# Patient Record
Sex: Female | Born: 1955 | Race: Black or African American | Hispanic: No | Marital: Single | Smoking: Never smoker
Health system: Southern US, Community
[De-identification: ages and names within clinical notes are randomized; demographics above are authoritative.]

## PROBLEM LIST (undated history)

## (undated) DIAGNOSIS — Z973 Presence of spectacles and contact lenses: Secondary | ICD-10-CM

## (undated) DIAGNOSIS — I10 Essential (primary) hypertension: Secondary | ICD-10-CM

## (undated) DIAGNOSIS — R7303 Prediabetes: Secondary | ICD-10-CM

## (undated) DIAGNOSIS — J45909 Unspecified asthma, uncomplicated: Secondary | ICD-10-CM

## (undated) DIAGNOSIS — E119 Type 2 diabetes mellitus without complications: Secondary | ICD-10-CM

## (undated) HISTORY — PX: CHOLECYSTECTOMY: SHX55

## (undated) HISTORY — DX: Type 2 diabetes mellitus without complications: E11.9

## (undated) HISTORY — PX: ABDOMINAL HYSTERECTOMY: SHX81

---

## 2011-03-01 HISTORY — PX: HERNIA REPAIR: SHX51

## 2013-07-05 ENCOUNTER — Ambulatory Visit: Payer: Self-pay | Admitting: Family Medicine

## 2013-07-05 DIAGNOSIS — I519 Heart disease, unspecified: Secondary | ICD-10-CM

## 2013-09-27 ENCOUNTER — Ambulatory Visit: Payer: Self-pay | Admitting: Family Medicine

## 2014-11-08 ENCOUNTER — Encounter (HOSPITAL_COMMUNITY): Payer: Self-pay | Admitting: *Deleted

## 2014-11-08 ENCOUNTER — Emergency Department (HOSPITAL_COMMUNITY)
Admission: EM | Admit: 2014-11-08 | Discharge: 2014-11-08 | Disposition: A | Discharge status: Home / Self Care | Payer: Federal, State, Local not specified - PPO | Attending: Emergency Medicine | Admitting: Emergency Medicine

## 2014-11-08 DIAGNOSIS — I10 Essential (primary) hypertension: Secondary | ICD-10-CM | POA: Diagnosis not present

## 2014-11-08 DIAGNOSIS — R252 Cramp and spasm: Secondary | ICD-10-CM | POA: Insufficient documentation

## 2014-11-08 DIAGNOSIS — Z87891 Personal history of nicotine dependence: Secondary | ICD-10-CM | POA: Diagnosis not present

## 2014-11-08 DIAGNOSIS — R42 Dizziness and giddiness: Secondary | ICD-10-CM | POA: Diagnosis present

## 2014-11-08 DIAGNOSIS — E860 Dehydration: Secondary | ICD-10-CM | POA: Insufficient documentation

## 2014-11-08 HISTORY — DX: Essential (primary) hypertension: I10

## 2014-11-08 LAB — I-STAT CHEM 8, ED
BUN: 28 mg/dL — ABNORMAL HIGH (ref 6–20)
Calcium, Ion: 1.2 mmol/L (ref 1.12–1.23)
Chloride: 104 mmol/L (ref 101–111)
Creatinine, Ser: 1.9 mg/dL — ABNORMAL HIGH (ref 0.44–1.00)
Glucose, Bld: 87 mg/dL (ref 65–99)
HCT: 44 % (ref 36.0–46.0)
Hemoglobin: 15 g/dL (ref 12.0–15.0)
Potassium: 3.4 mmol/L — ABNORMAL LOW (ref 3.5–5.1)
Sodium: 144 mmol/L (ref 135–145)
TCO2: 22 mmol/L (ref 0–100)

## 2014-11-08 LAB — CK: CK TOTAL: 563 U/L — AB (ref 38–234)

## 2014-11-08 MED ORDER — METHOCARBAMOL 500 MG PO TABS: 500.0000 mg | ORAL_TABLET | Freq: Four times a day (QID) | ORAL | Status: DC | PRN

## 2014-11-08 MED ORDER — SODIUM CHLORIDE 0.9 % IV BOLUS (SEPSIS)
1000.0000 mL | Freq: Once | INTRAVENOUS | Status: AC
  Administered 2014-11-08: 1000 mL via INTRAVENOUS

## 2014-11-08 MED ORDER — METHOCARBAMOL 1000 MG/10ML IJ SOLN
1000.0000 mg | Freq: Once | INTRAVENOUS | Status: AC
  Administered 2014-11-08: 1000 mg via INTRAVENOUS
  Filled 2014-11-08: qty 10

## 2014-11-08 MED ORDER — SODIUM CHLORIDE 0.9 % IV BOLUS (SEPSIS)
1000.0000 mL | Freq: Once | INTRAVENOUS | Status: AC
Start: 2014-11-08 — End: 2014-11-08
  Administered 2014-11-08: 1000 mL via INTRAVENOUS

## 2014-11-08 NOTE — ED Notes (Signed)
Pt was dancing at [employer], not drinking fluids, EMS suspect ETOH but pt denies.  Pt c/o cramping in hands and dehydration.  EMS: 18 L AC, NS, 4 zophran, 114/80, Normal Sinus on monitor, CBG 129

## 2014-11-08 NOTE — ED Provider Notes (Signed)
CSN: [id number]     Arrival date & time 11/08/14  1730 History   First MD Initiated Contact with Patient 11/08/14 1735     Chief Complaint  Patient presents with  . Dehydration     (Consider location/radiation/quality/duration/timing/severity/associated sxs/prior Treatment) The history is provided by the patient.     Per EMS [place], developed cramping all over. Pt states she ate some food from a food truck that tasted bad, states she then got light headed and nauseated and started cramping all over.  She did eat breakfast this morning.  The cramping and nausea have resolved and currently she feels fine, though she does have occasional sharp cramps in her legs.  She thinks she is dehydrated and has food poisoning.  Denies vomiting, diarrhea, abdominal pain, CP, SOB.    Past Medical History  Diagnosis Date  . Hypertension    Past Surgical History  Procedure Laterality Date  . Hernia repair  2013    abdominal.   History reviewed. No pertinent family history. Social History  Substance Use Topics  . Smoking status: Former Smoker    Types: Cigarettes  . Smokeless tobacco: None  . Alcohol Use: Yes   OB History    No data available     Review of Systems  Constitutional: Negative for fever.  Respiratory: Negative for shortness of breath.   Cardiovascular: Negative for chest pain.  Gastrointestinal: Positive for nausea. Negative for vomiting, abdominal pain and diarrhea.  Musculoskeletal: Positive for myalgias.  Skin: Negative for rash.  Allergic/Immunologic: Negative for immunocompromised state.  Neurological: Negative for weakness and numbness.  Hematological: Does not bruise/bleed easily.  Psychiatric/Behavioral: Negative for self-injury.      Allergies  Review of patient's allergies indicates no known allergies.  Home Medications   Prior to Admission medications   Not on File   BP 104/63 mmHg  Pulse 90  Temp(Src) 97.9 F (36.6 C)  (Oral)  Resp 18  Ht  (1.676 m)  Wt 225 lb (102.059 kg)  BMI 36.33 kg/m2  SpO2 95% Physical Exam  Constitutional: She appears well-developed and well-nourished. No distress.  HENT:  Head: Normocephalic and atraumatic.  Neck: Neck supple.  Cardiovascular: Normal rate and regular rhythm.   Pulmonary/Chest: Effort normal and breath sounds normal. No respiratory distress. She has no wheezes. She has no rales.  Abdominal: Soft. She exhibits no distension. There is no tenderness. There is no rebound and no guarding.  Musculoskeletal: She exhibits no edema or tenderness.  Neurological: She is alert.  Skin: She is not diaphoretic.  Psychiatric: She has a normal mood and affect. Her behavior is normal.  Nursing note and vitals reviewed.   ED Course  Procedures (including critical care time) Labs Review Labs Reviewed  CK - Abnormal; Notable for the following:    Total CK 563 (*)    All other components within normal limits  I-STAT CHEM 8, ED - Abnormal; Notable for the following:    Potassium 3.4 (*)    BUN 28 (*)    Creatinine, Ser 1.90 (*)    All other components within normal limits    Imaging Review No results found. I have personally reviewed and evaluated these images and lab results as part of my medical decision-making.   EKG Interpretation None       8:01 PM Pt requests to leave, declines staying to finish second liter IVF.  Reviewed labs with Dr Madilyn Hook.  Pt advised to drink  lots of water, hold her lisinopril/HCTZ and monitor her blood pressure at home, follow up Monday with her PCP to have her renal function rechecked.   MDM   Final diagnoses:  Dehydration    Afebrile, nontoxic patient with muscle cramping, lightheadedness, and nausea while outside in the heat at the local folk [place].  Found to be severely dehydrated.  BUN creat 28, 1.9.  CK 563.  No prior renal function studies available.  Pt given IVF, robaxin for muscle cramps with great improvement.  Pt  declined to stay for second liter IVF, feeling much better.  Discussed labs, encouraged PO intake, close recheck with PCP, hold BP medication until cleared by PCP.   D/C home with robaxin.  Discussed result, findings, treatment, and follow up  with patient.  Pt given return precautions.  Pt verbalizes understanding and agrees with plan.         Trixie Dredge, PA-C 11/08/14 2111  Tilden Fossa, MD 11/09/14 [phone]

## 2014-11-08 NOTE — Discharge Instructions (Signed)
Read the information below.  Use the prescribed medication as directed.  Please discuss all new medications with your pharmacist.  You may return to the Emergency Department at any time for worsening condition or any new symptoms that concern you.   Please drink lots of fluids over the next few days.   See you primary care provider as soon as possible for a recheck of you kidney function.  Until then, Do Not take your lisinopril-HCTZ but do monitor you blood pressure closely.    Dehydration, Adult Dehydration is when you lose more fluids from the body than you take in. Vital organs like the kidneys, brain, and heart cannot function without a proper amount of fluids and salt. Any loss of fluids from the body can cause dehydration.  CAUSES   Vomiting.  Diarrhea.  Excessive sweating.  Excessive urine output.  Fever. SYMPTOMS  Mild dehydration  Thirst.  Dry lips.  Slightly dry mouth. Moderate dehydration  Very dry mouth.  Sunken eyes.  Skin does not bounce back quickly when lightly pinched and released.  Dark urine and decreased urine production.  Decreased tear production.  Headache. Severe dehydration  Very dry mouth.  Extreme thirst.  Rapid, weak pulse (more than 100 beats per minute at rest).  Cold hands and feet.  Not able to sweat in spite of heat and temperature.  Rapid breathing.  Blue lips.  Confusion and lethargy.  Difficulty being awakened.  Minimal urine production.  No tears. DIAGNOSIS  Your caregiver will diagnose dehydration based on your symptoms and your exam. Blood and urine tests will help confirm the diagnosis. The diagnostic evaluation should also identify the cause of dehydration. TREATMENT  Treatment of mild or moderate dehydration can often be done at home by increasing the amount of fluids that you drink. It is best to drink small amounts of fluid more often. Drinking too much at one time can make vomiting worse. Refer to the  home care instructions below. Severe dehydration needs to be treated at the hospital where you will probably be given intravenous (IV) fluids that contain water and electrolytes. HOME CARE INSTRUCTIONS   Ask your caregiver about specific rehydration instructions.  Drink enough fluids to keep your urine clear or pale yellow.  Drink small amounts frequently if you have nausea and vomiting.  Eat as you normally do.  Avoid:  Foods or drinks high in sugar.  Carbonated drinks.  Juice.  Extremely hot or cold fluids.  Drinks with caffeine.  Fatty, greasy foods.  Alcohol.  Tobacco.  Overeating.  Gelatin desserts.  Wash your hands well to avoid spreading bacteria and viruses.  Only take over-the-counter or prescription medicines for pain, discomfort, or fever as directed by your caregiver.  Ask your caregiver if you should continue all prescribed and over-the-counter medicines.  Keep all follow-up appointments with your caregiver. SEEK MEDICAL CARE IF:  You have abdominal pain and it increases or stays in one area (localizes).  You have a rash, stiff neck, or severe headache.  You are irritable, sleepy, or difficult to awaken.  You are weak, dizzy, or extremely thirsty. SEEK IMMEDIATE MEDICAL CARE IF:   You are unable to keep fluids down or you get worse despite treatment.  You have frequent episodes of vomiting or diarrhea.  You have blood or green matter (bile) in your vomit.  You have blood in your stool or your stool looks black and tarry.  You have not urinated in 6 to 8 hours, or you  have only urinated a small amount of very dark urine.  You have a fever.  You faint. MAKE SURE YOU:   Understand these instructions.  Will watch your condition.  Will get help right away if you are not doing well or get worse. Document Released: 02/14/2005 Document Revised: 05/09/2011 Document Reviewed: 10/04/2010 [facility] Patient Information 2015 [place], [place]. This  information is not intended to replace advice given to you by your health care provider. Make sure you discuss any questions you have with your health care provider.

## 2015-05-04 ENCOUNTER — Other Ambulatory Visit: Payer: Self-pay | Admitting: Family Medicine

## 2015-05-04 DIAGNOSIS — Z1231 Encounter for screening mammogram for malignant neoplasm of breast: Secondary | ICD-10-CM

## 2016-07-23 ENCOUNTER — Inpatient Hospital Stay: Payer: Federal, State, Local not specified - PPO

## 2016-07-23 ENCOUNTER — Emergency Department: Payer: Federal, State, Local not specified - PPO

## 2016-07-23 ENCOUNTER — Inpatient Hospital Stay
Admission: EM | Admit: 2016-07-23 | Discharge: 2016-07-26 | DRG: 390 | Disposition: A | Discharge status: Home / Self Care | Payer: Federal, State, Local not specified - PPO | Attending: General Surgery | Admitting: General Surgery

## 2016-07-23 DIAGNOSIS — Z7984 Long term (current) use of oral hypoglycemic drugs: Secondary | ICD-10-CM

## 2016-07-23 DIAGNOSIS — Z87891 Personal history of nicotine dependence: Secondary | ICD-10-CM

## 2016-07-23 DIAGNOSIS — Z79899 Other long term (current) drug therapy: Secondary | ICD-10-CM

## 2016-07-23 DIAGNOSIS — K56609 Unspecified intestinal obstruction, unspecified as to partial versus complete obstruction: Secondary | ICD-10-CM | POA: Diagnosis present

## 2016-07-23 DIAGNOSIS — K56600 Partial intestinal obstruction, unspecified as to cause: Principal | ICD-10-CM | POA: Diagnosis present

## 2016-07-23 DIAGNOSIS — Z978 Presence of other specified devices: Secondary | ICD-10-CM

## 2016-07-23 HISTORY — DX: Unspecified asthma, uncomplicated: J45.909

## 2016-07-23 LAB — COMPREHENSIVE METABOLIC PANEL
ALBUMIN: 4.1 g/dL (ref 3.5–5.0)
ALK PHOS: 50 U/L (ref 38–126)
ALT: 19 U/L (ref 14–54)
AST: 25 U/L (ref 15–41)
Anion gap: 7 (ref 5–15)
BILIRUBIN TOTAL: 0.7 mg/dL (ref 0.3–1.2)
BUN: 16 mg/dL (ref 6–20)
CALCIUM: 9.3 mg/dL (ref 8.9–10.3)
CO2: 28 mmol/L (ref 22–32)
CREATININE: 0.71 mg/dL (ref 0.44–1.00)
Chloride: 104 mmol/L (ref 101–111)
GFR calc Af Amer: >60 mL/min (ref >60)
GFR calc non Af Amer: >60 mL/min (ref >60)
GLUCOSE: 147 mg/dL — AB (ref 65–99)
Potassium: 3.5 mmol/L (ref 3.5–5.1)
Sodium: 139 mmol/L (ref 135–145)
TOTAL PROTEIN: 8.1 g/dL (ref 6.5–8.1)

## 2016-07-23 LAB — CBC
HCT: 39.4 % (ref 35.0–47.0)
HEMOGLOBIN: 13.3 g/dL (ref 12.0–16.0)
MCH: 28.6 pg (ref 26.0–34.0)
MCHC: 33.7 g/dL (ref 32.0–36.0)
MCV: 84.9 fL (ref 80.0–100.0)
PLATELETS: 169 10*3/uL (ref 150–440)
RBC: 4.64 MIL/uL (ref 3.80–5.20)
RDW: 14.1 % (ref 11.5–14.5)
WBC: 10.6 10*3/uL (ref 3.6–11.0)

## 2016-07-23 LAB — URINALYSIS, COMPLETE (UACMP) WITH MICROSCOPIC
BILIRUBIN URINE: NEGATIVE
Bacteria, UA: NONE SEEN
Glucose, UA: NEGATIVE mg/dL
Hgb urine dipstick: NEGATIVE
KETONES UR: 5 mg/dL — AB
Leukocytes, UA: NEGATIVE
Nitrite: NEGATIVE
Protein, ur: 100 mg/dL — AB
Specific Gravity, Urine: 1.016 (ref 1.005–1.030)
pH: 8 (ref 5.0–8.0)

## 2016-07-23 LAB — TYPE AND SCREEN
ABO/RH(D): O POS
Antibody Screen: NEGATIVE

## 2016-07-23 LAB — TROPONIN I

## 2016-07-23 LAB — GLUCOSE, CAPILLARY
GLUCOSE-CAPILLARY: 110 mg/dL — AB (ref 65–99)
Glucose-Capillary: 133 mg/dL — ABNORMAL HIGH (ref 65–99)

## 2016-07-23 LAB — LIPASE, BLOOD: Lipase: 26 U/L (ref 11–51)

## 2016-07-23 IMAGING — CR DG ABDOMEN ACUTE W/ 1V CHEST
3 series · 3 of 3 positions shown · non-contrast
Comparison: None.

CLINICAL DATA: Nausea and vomiting.  Abdominal pain.

EXAM:
DG ABDOMEN ACUTE W/ 1V CHEST

[chest pa]
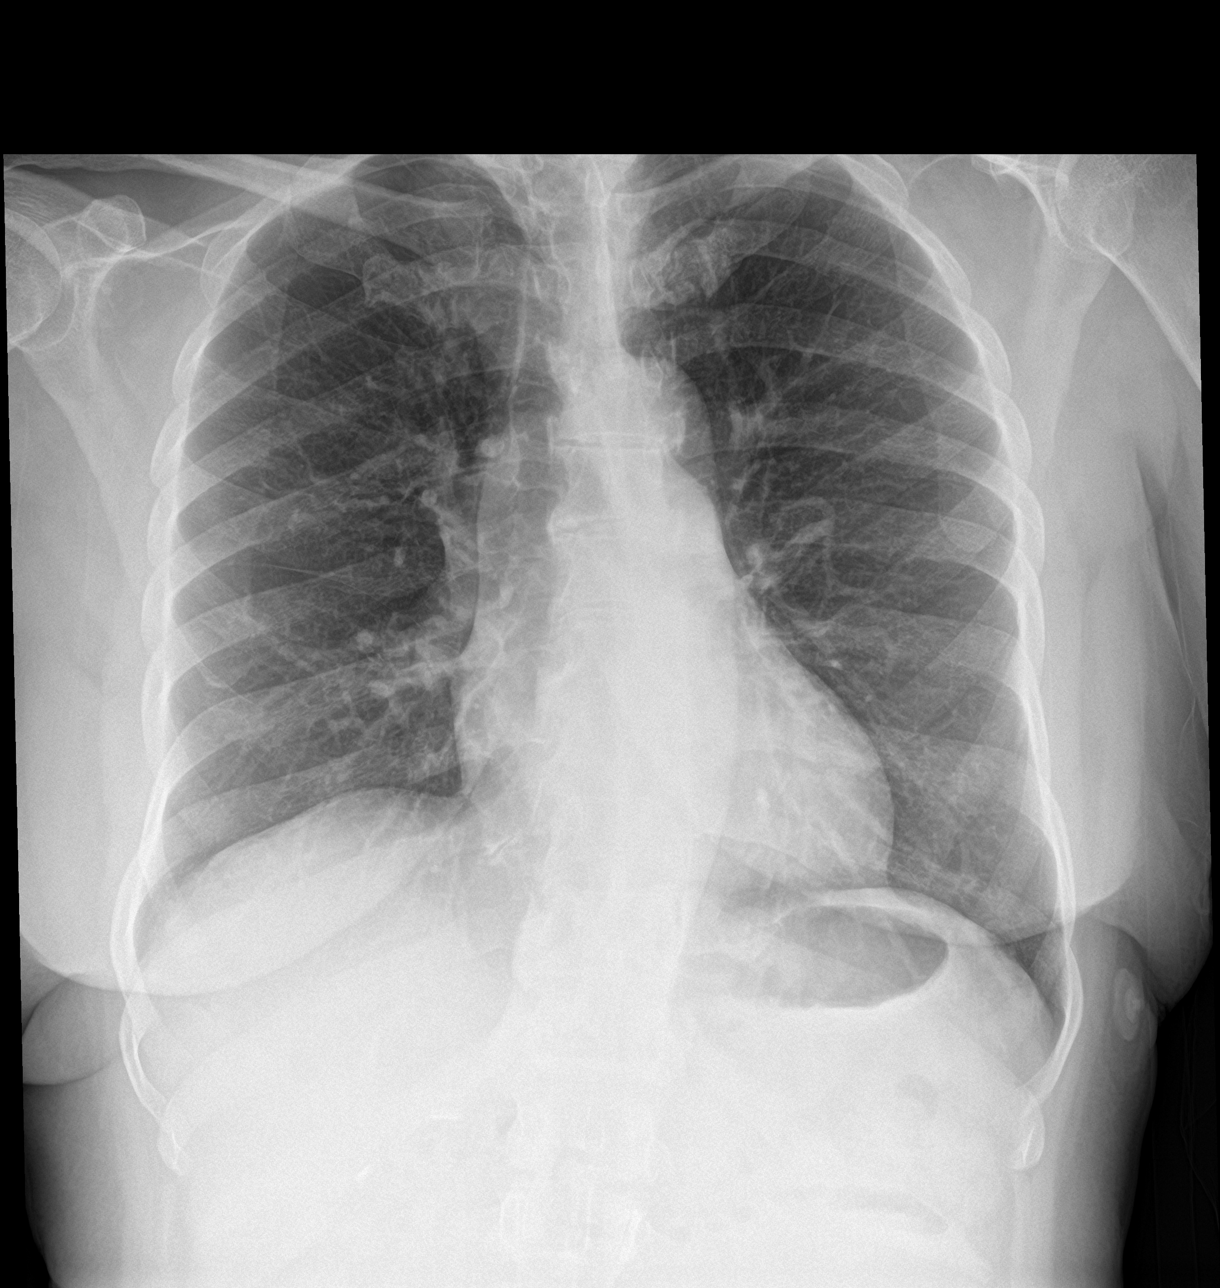

[abdomen erect]
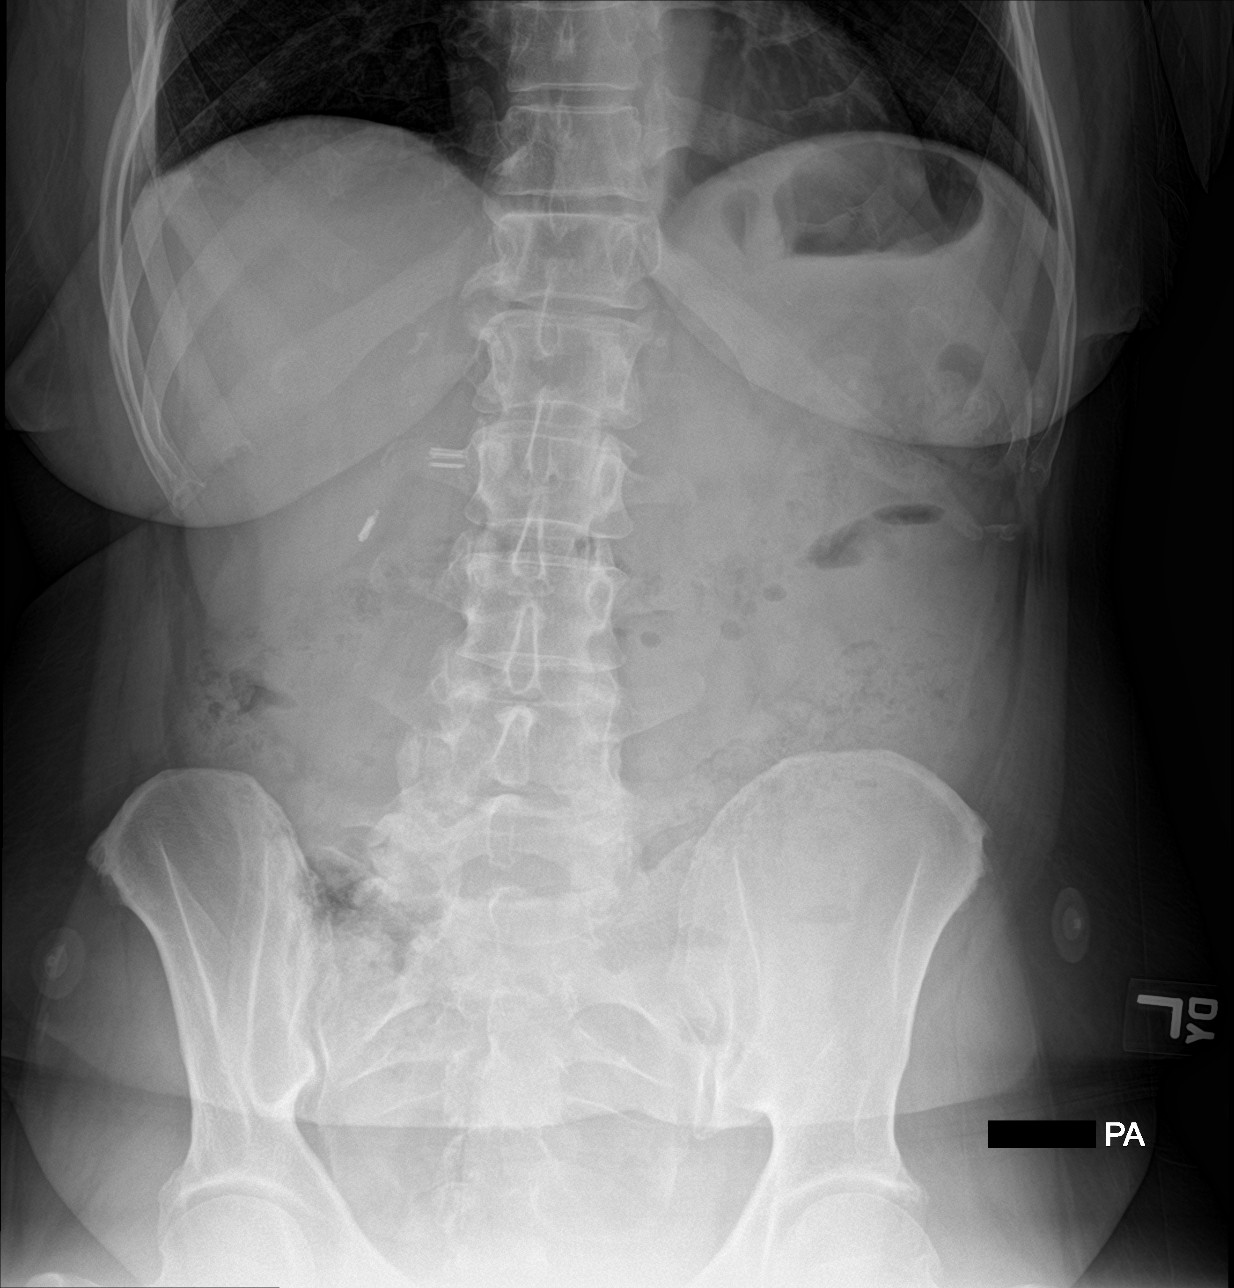

[abdomen supine]
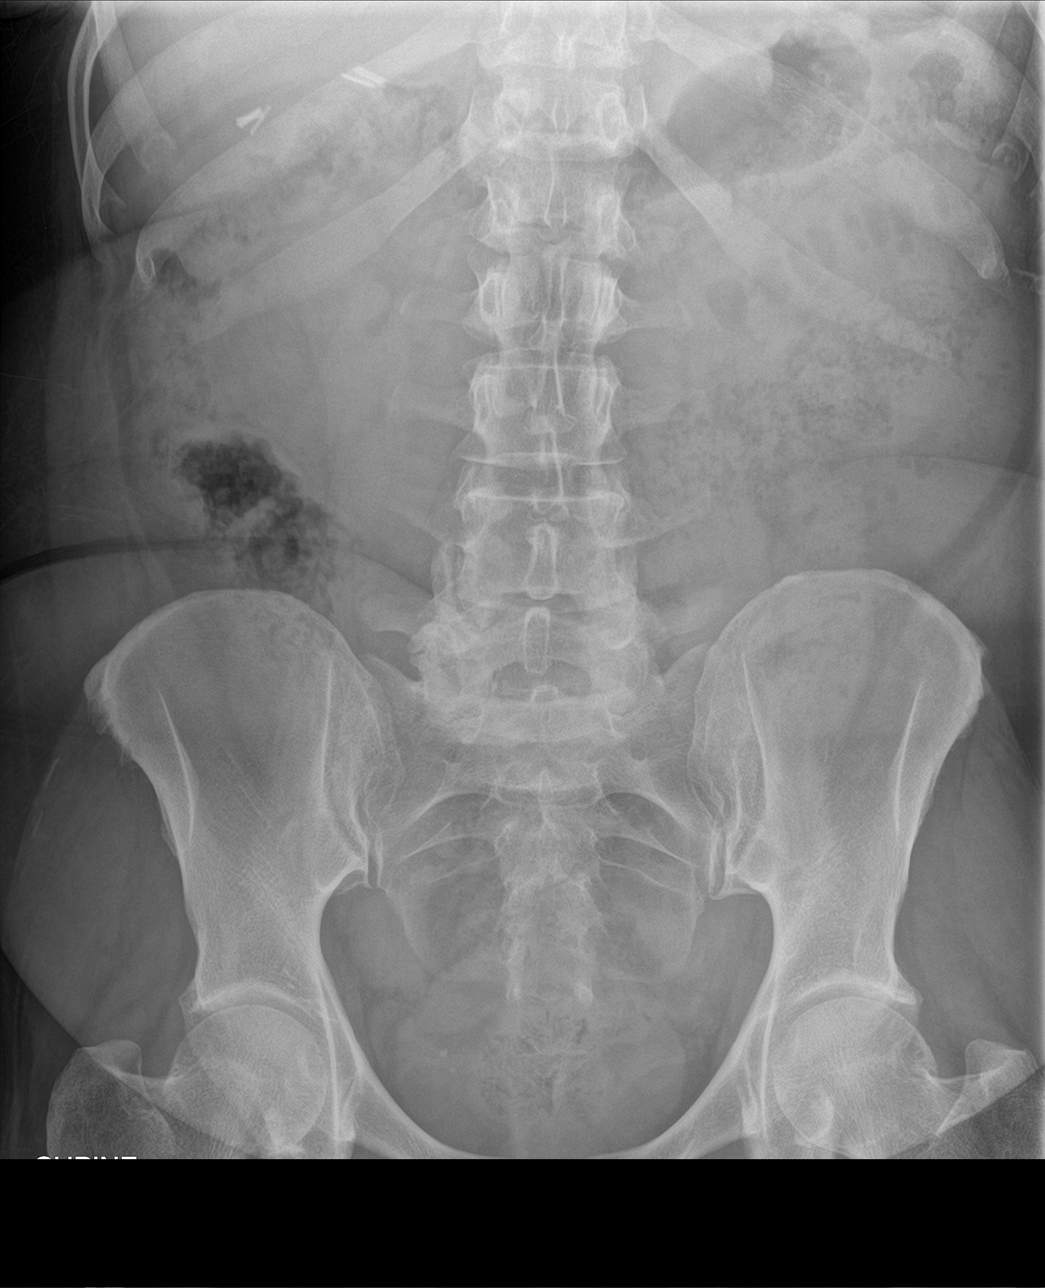

[3 of 3 positions shown; findings below may reference images not displayed]

FINDINGS: There is no evidence of dilated bowel loops or free intraperitoneal
air. No radiopaque calculi or other significant radiographic
abnormality is seen. Heart size and mediastinal contours are within
normal limits. Both lungs are clear.
IMPRESSION: Negative abdominal radiographs.  No acute cardiopulmonary disease.

## 2016-07-23 IMAGING — CT CT ABD-PELV W/ CM
2 of 5 series · 16 of 46 positions shown, 18 images · IV contrast (APPLIED)
Comparison: None.

CLINICAL DATA: Nausea and vomiting for several hours

EXAM:
CT ABDOMEN AND PELVIS WITH CONTRAST
TECHNIQUE: Multidetector CT imaging of the abdomen and pelvis was performed
using the standard protocol following bolus administration of
intravenous contrast.
CONTRAST:  100mL [L9] IOPAMIDOL ([L9]) INJECTION 61%

[Series 2: routine abd/pel with · axial · 0.70mm/px · z∈[-429,-44]mm · 13 of 87 slices shown, 15 images]
[im 5/87  soft-tissue]
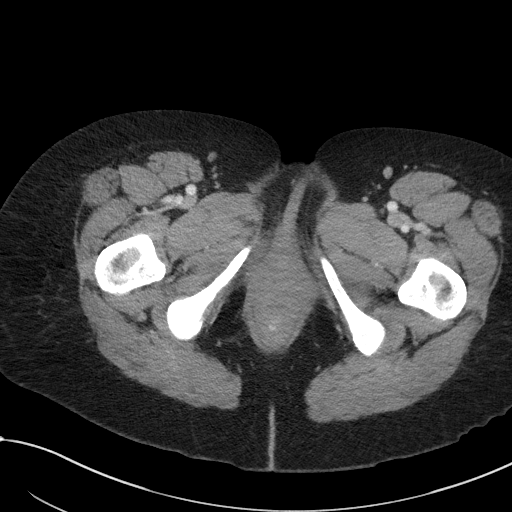
[im 5/87  bone]
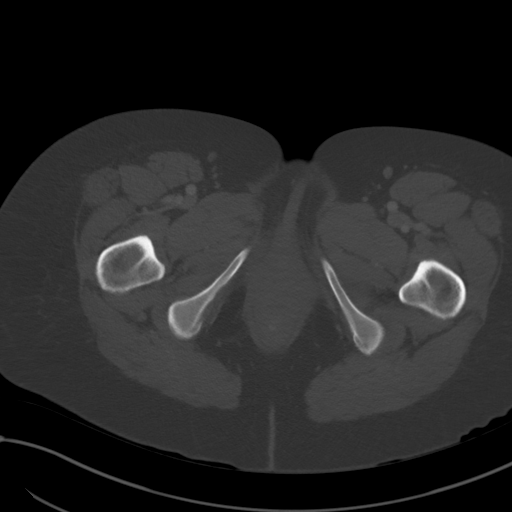
[im 10/87  soft-tissue]
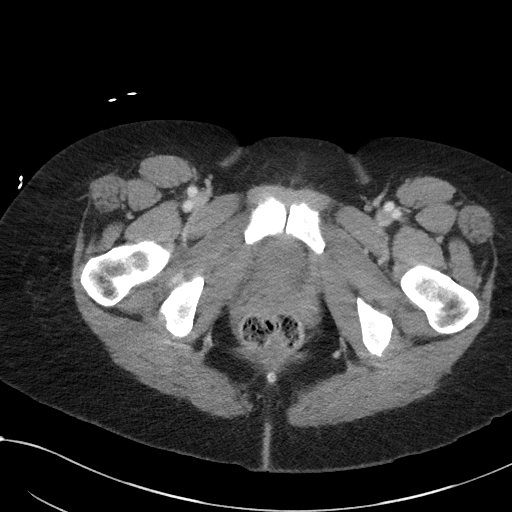
[im 20/87  soft-tissue]
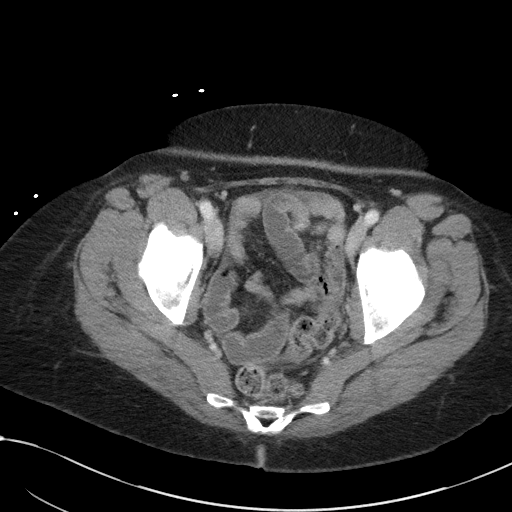
[im 24/87  soft-tissue]
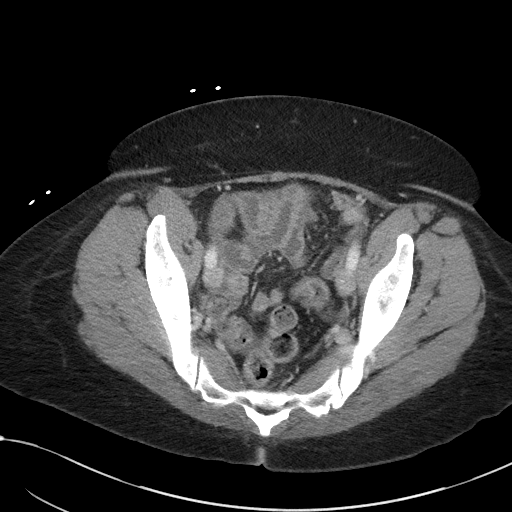
[im 29/87  soft-tissue]
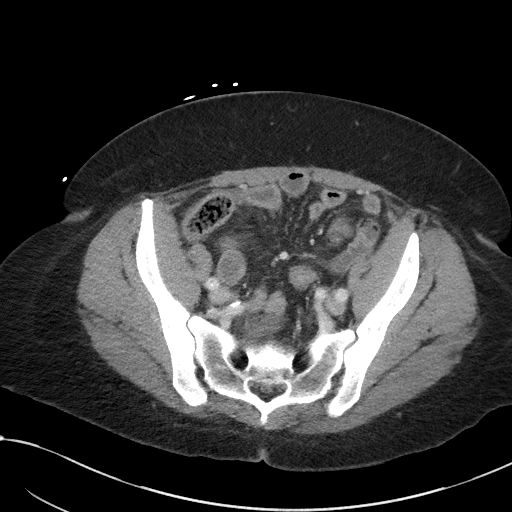
[im 39/87  soft-tissue]
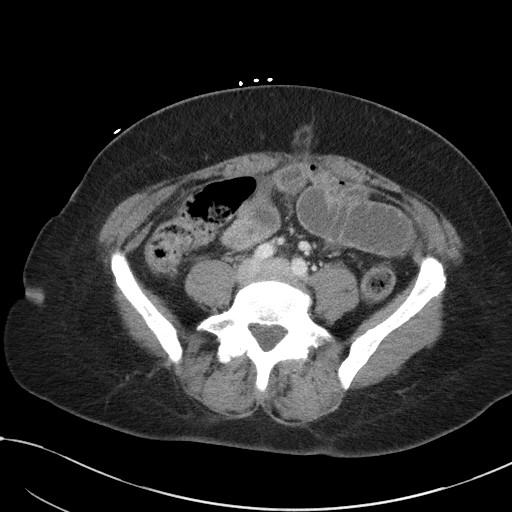
[im 44/87  soft-tissue]
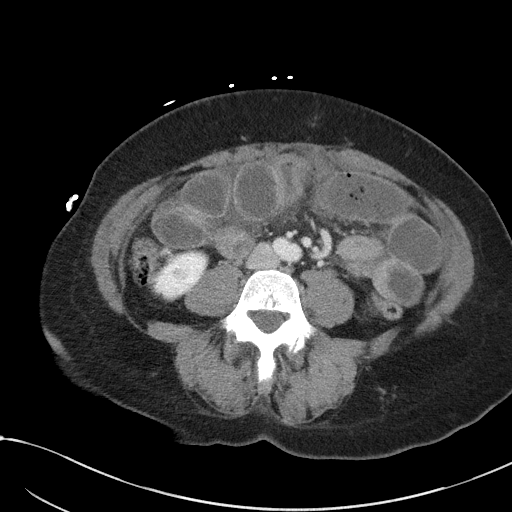
[im 48/87  soft-tissue]
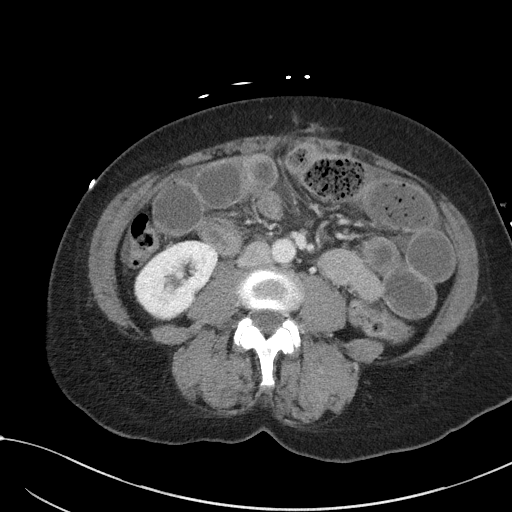
[im 58/87  soft-tissue]
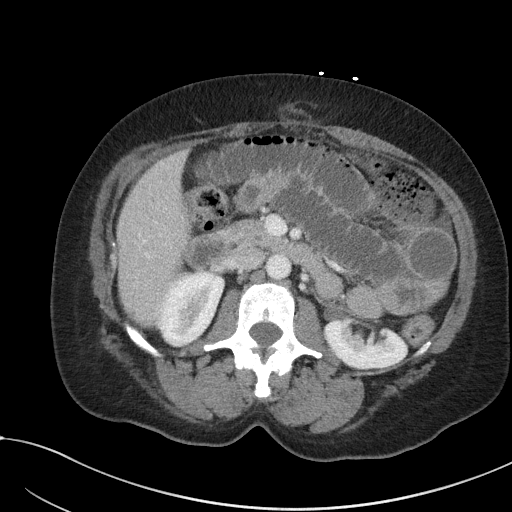
[im 58/87  bone]
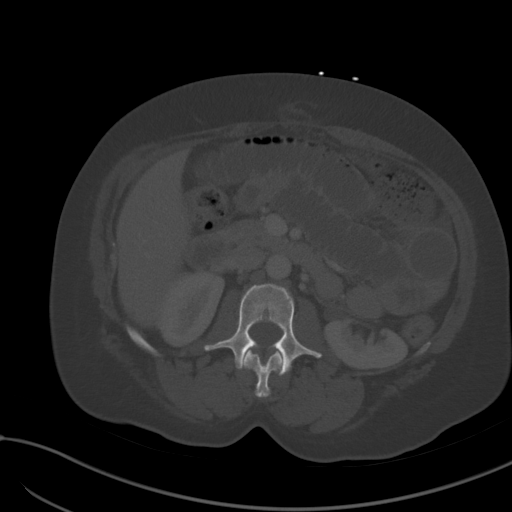
[im 63/87  soft-tissue]
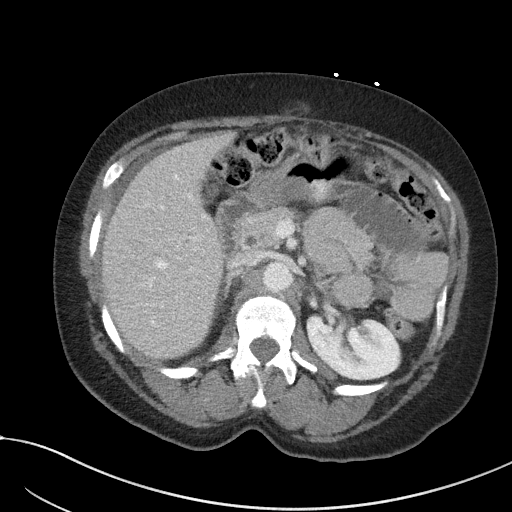
[im 67/87  soft-tissue]
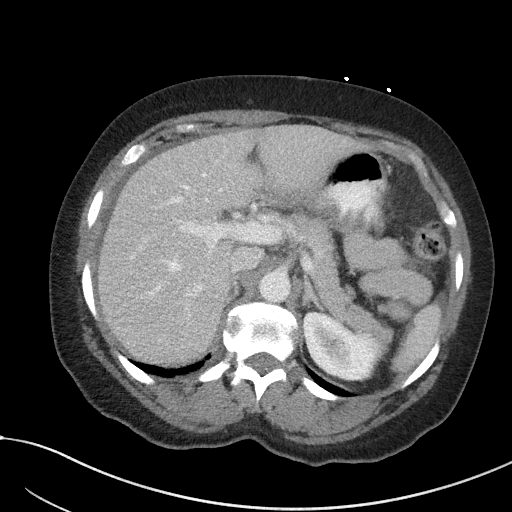
[im 77/87  soft-tissue]
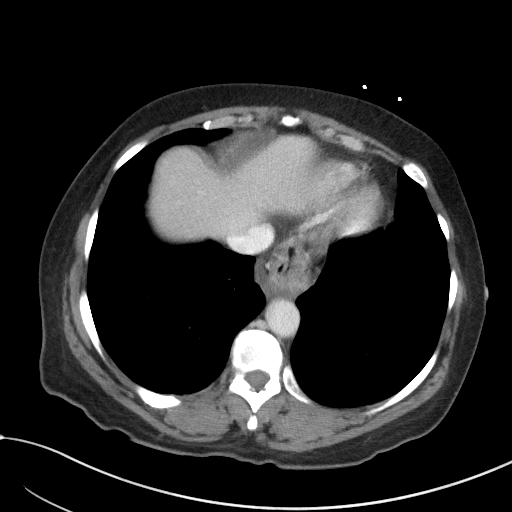
[im 82/87  soft-tissue]
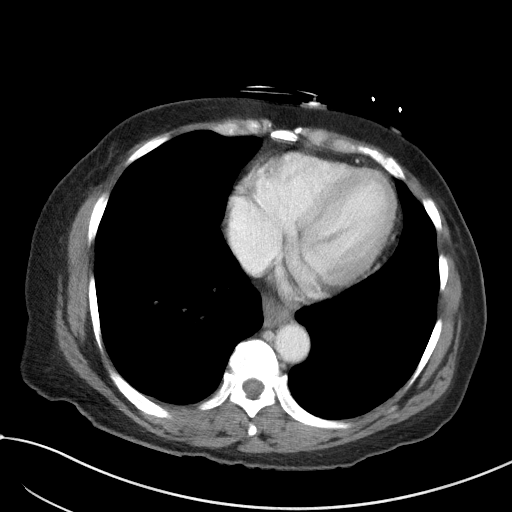

[Series 5: coronal st · coronal · 0.68mm/px · 3 of 89 slices shown]
[im 30/89  soft-tissue]
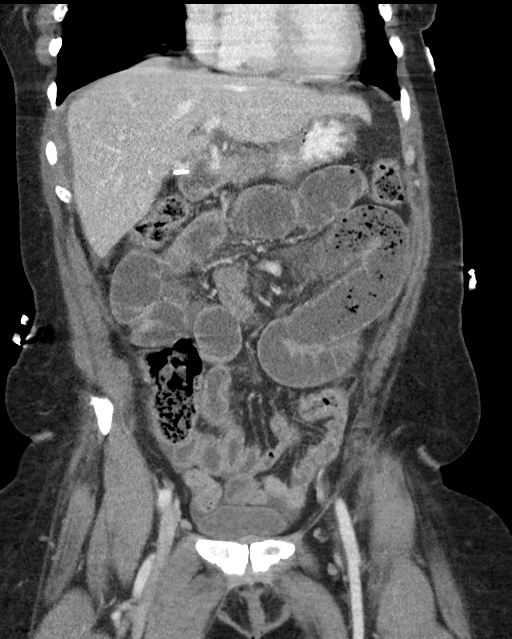
[im 40/89  soft-tissue]
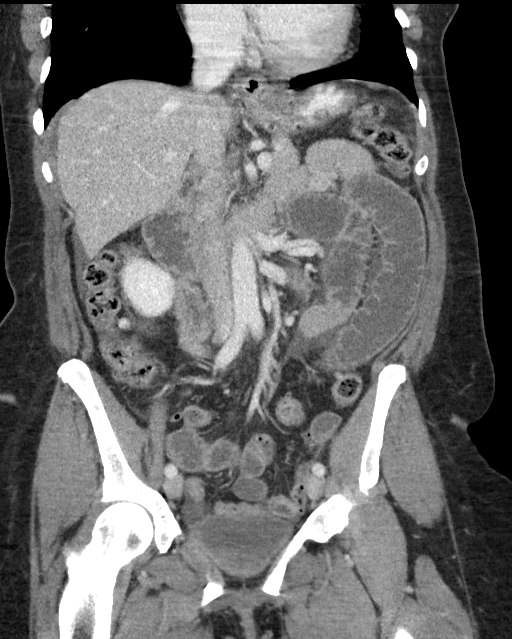
[im 49/89  soft-tissue]
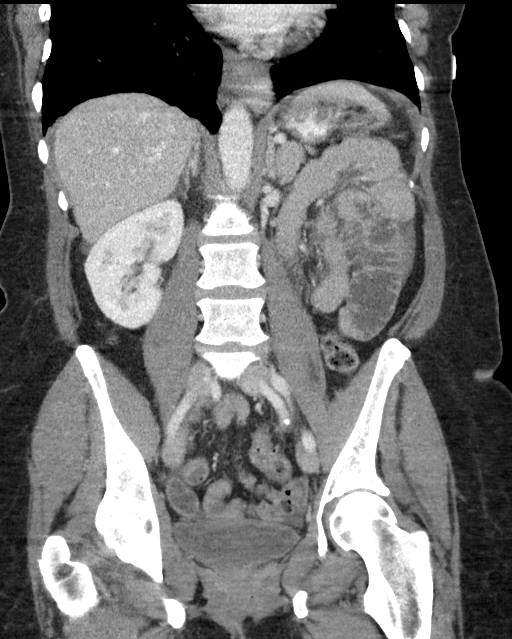

[16 of 46 positions shown; findings below may reference images not displayed]

FINDINGS: Lower chest: Lung bases are free of acute infiltrate or sizable
effusion. Small hiatal hernia is noted.

Hepatobiliary: No focal liver abnormality is seen. Status post
cholecystectomy. No biliary dilatation.

Pancreas: Unremarkable. No pancreatic ductal dilatation or
surrounding inflammatory changes.

Spleen: Normal in size without focal abnormality.

Adrenals/Urinary Tract: Adrenal glands are within normal limits. The
kidneys demonstrate tiny nonobstructing renal stones on the left. No
obstructive changes are seen on the right. Bladder is partially
distended.

Stomach/Bowel: There multiple dilated loops of small bowel in the
mid jejunum extending distally into the proximal ileum. The most
proximal aspect of the chin and distal most aspect of the ileum are
within normal limits. Some fecalization of small bowel contents is
noted. The transition point is noted in the anterior mid abdomen
best seen on the coronal imaging (image 20 of series 5). The
appendix is unremarkable. More distal colon is within normal limits.

Vascular/Lymphatic: Aortic atherosclerosis. No enlarged abdominal or
pelvic lymph nodes.

Reproductive: Status post hysterectomy. No adnexal masses.

Other: Small anterior abdominal hernias are identified containing
fat. Some mild inflammatory change is noted surrounding the inferior
hernia just above the umbilicus. This is best visualized on image
number 57 of series 6. This is adjacent to the transition zone of
the small bowel dilatation and may contribute some
scarring/adhesions contributing to the small-bowel obstruction.

Mild free fluid is noted within the abdomen and pelvis.

Musculoskeletal: Degenerative changes of lumbar spine are noted.
IMPRESSION: Small-bowel obstruction which appears likely related to some
adhesions along the anterior aspect of the abdomen. A small fat
containing abdominal wall hernia is noted associated with these
changes.

Nonobstructing left renal stones.

No other focal abnormality is noted.

## 2016-07-23 IMAGING — DX DG ABD PORTABLE 1V
1 series · 1 of 1 positions shown · non-contrast
Comparison: None.

CLINICAL DATA: NG tube placement

EXAM:
PORTABLE ABDOMEN - 1 VIEW

[abdomen kub]
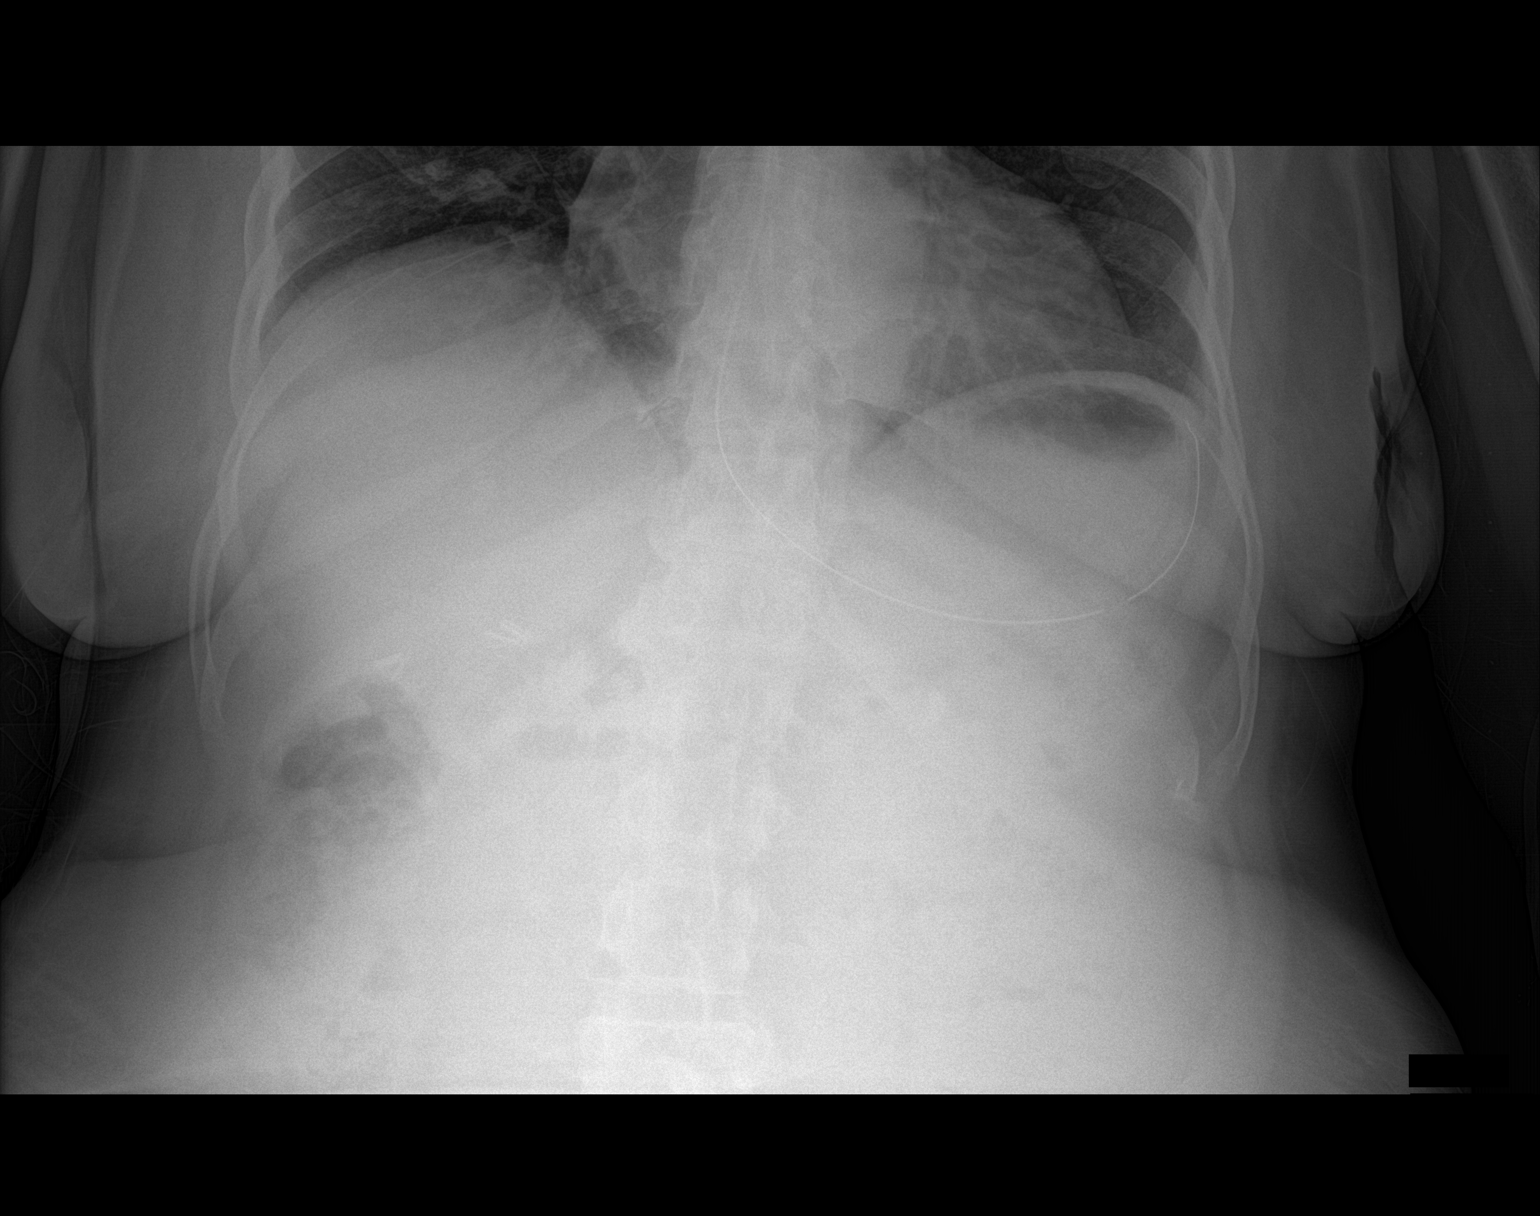

[1 of 1 positions shown; findings below may reference images not displayed]

FINDINGS: Nasogastric tube appears appropriately positioned in the stomach
with tip directed towards the stomach fundus.

Visualized bowel gas pattern is nonobstructive. Visualized lung
bases appear clear.
IMPRESSION: Nasogastric tube appears appropriately positioned in the stomach.

## 2016-07-23 MED ORDER — KETOROLAC TROMETHAMINE 30 MG/ML IJ SOLN
30.0000 mg | Freq: Once | INTRAMUSCULAR | Status: AC
  Administered 2016-07-23: 30 mg via INTRAVENOUS
  Filled 2016-07-23: qty 1

## 2016-07-23 MED ORDER — INSULIN ASPART 100 UNIT/ML ~~LOC~~ SOLN: 0.0000 [IU] | SUBCUTANEOUS | Status: DC

## 2016-07-23 MED ORDER — PANTOPRAZOLE SODIUM 40 MG IV SOLR
40.0000 mg | Freq: Every day | INTRAVENOUS | Status: DC
  Administered 2016-07-23 – 2016-07-25 (×3): 40 mg via INTRAVENOUS
  Filled 2016-07-23 (×3): qty 40

## 2016-07-23 MED ORDER — SODIUM CHLORIDE 0.9 % IV BOLUS (SEPSIS)
1000.0000 mL | Freq: Once | INTRAVENOUS | Status: AC
  Administered 2016-07-23: 1000 mL via INTRAVENOUS

## 2016-07-23 MED ORDER — GI COCKTAIL ~~LOC~~
30.0000 mL | Freq: Once | ORAL | Status: DC
  Filled 2016-07-23: qty 30

## 2016-07-23 MED ORDER — PROMETHAZINE HCL 25 MG/ML IJ SOLN
12.5000 mg | Freq: Once | INTRAMUSCULAR | Status: AC
  Administered 2016-07-23: 12.5 mg via INTRAVENOUS
  Filled 2016-07-23: qty 1

## 2016-07-23 MED ORDER — PNEUMOCOCCAL VAC POLYVALENT 25 MCG/0.5ML IJ INJ
0.5000 mL | INJECTION | INTRAMUSCULAR | Status: AC
  Administered 2016-07-24: 0.5 mL via INTRAMUSCULAR
  Filled 2016-07-23: qty 0.5

## 2016-07-23 MED ORDER — ENOXAPARIN SODIUM 40 MG/0.4ML ~~LOC~~ SOLN
40.0000 mg | SUBCUTANEOUS | Status: DC
  Administered 2016-07-24 – 2016-07-25 (×3): 40 mg via SUBCUTANEOUS
  Filled 2016-07-23 (×3): qty 0.4

## 2016-07-23 MED ORDER — ONDANSETRON 4 MG PO TBDP: 4.0000 mg | ORAL_TABLET | Freq: Four times a day (QID) | ORAL | Status: DC | PRN

## 2016-07-23 MED ORDER — IOPAMIDOL (ISOVUE-300) INJECTION 61%
30.0000 mL | Freq: Once | INTRAVENOUS | Status: AC | PRN
  Administered 2016-07-23: 30 mL via ORAL

## 2016-07-23 MED ORDER — MORPHINE SULFATE (PF) 4 MG/ML IV SOLN
4.0000 mg | Freq: Once | INTRAVENOUS | Status: AC
  Administered 2016-07-23: 4 mg via INTRAVENOUS
  Filled 2016-07-23: qty 1

## 2016-07-23 MED ORDER — HYDRALAZINE HCL 20 MG/ML IJ SOLN: 10.0000 mg | INTRAMUSCULAR | Status: DC | PRN

## 2016-07-23 MED ORDER — HALOPERIDOL LACTATE 5 MG/ML IJ SOLN
2.0000 mg | Freq: Once | INTRAMUSCULAR | Status: AC
  Administered 2016-07-23: 2 mg via INTRAVENOUS

## 2016-07-23 MED ORDER — DIPHENHYDRAMINE HCL 12.5 MG/5ML PO ELIX: 12.5000 mg | ORAL_SOLUTION | Freq: Four times a day (QID) | ORAL | Status: DC | PRN

## 2016-07-23 MED ORDER — HALOPERIDOL LACTATE 5 MG/ML IJ SOLN
2.5000 mg | Freq: Once | INTRAMUSCULAR | Status: AC
  Administered 2016-07-23: 2.5 mg via INTRAVENOUS
  Filled 2016-07-23: qty 1

## 2016-07-23 MED ORDER — MORPHINE SULFATE (PF) 4 MG/ML IV SOLN
4.0000 mg | INTRAVENOUS | Status: DC | PRN
  Administered 2016-07-23 – 2016-07-24 (×3): 4 mg via INTRAVENOUS
  Filled 2016-07-23 (×3): qty 1

## 2016-07-23 MED ORDER — DEXTROSE IN LACTATED RINGERS 5 % IV SOLN
INTRAVENOUS | Status: DC
  Administered 2016-07-23 – 2016-07-24 (×3): via INTRAVENOUS
  Administered 2016-07-25: 1000 mL via INTRAVENOUS

## 2016-07-23 MED ORDER — DIPHENHYDRAMINE HCL 50 MG/ML IJ SOLN: 12.5000 mg | Freq: Four times a day (QID) | INTRAMUSCULAR | Status: DC | PRN

## 2016-07-23 MED ORDER — ONDANSETRON HCL 4 MG/2ML IJ SOLN
4.0000 mg | Freq: Four times a day (QID) | INTRAMUSCULAR | Status: DC | PRN
  Administered 2016-07-23: 4 mg via INTRAVENOUS
  Filled 2016-07-23: qty 2

## 2016-07-23 MED ORDER — IOPAMIDOL (ISOVUE-300) INJECTION 61%
100.0000 mL | Freq: Once | INTRAVENOUS | Status: AC | PRN
  Administered 2016-07-23: 100 mL via INTRAVENOUS

## 2016-07-23 NOTE — ED Provider Notes (Signed)
CT scan shows small bowel obstruction. I discussed the case with the on-call surgeon Dr. Aleen CampiPiscoya who will kindly consult.   Merrily Brittleifenbark, Jayson Waterhouse, MD 07/23/16 1547

## 2016-07-23 NOTE — ED Provider Notes (Addendum)
[facility]  ____________________________________________   I have reviewed the triage vital signs and the nursing notes.   HISTORY  Chief Complaint Nausea and Emesis    HPI Mary Montes is a 61 y.o. female who presents today complaining of nausea and vomiting abdominal discomfort. The pain is in her epigastric region. Nothing visible nothing makes it worse does not radiate. She's had no chest pain or shortness of breath. She is having nonbloody nonbilious vomiting. She received fentanyl from EMS. This happened approximately 12 hours ago and has been persistent. She did have a slightly loose stool. She has not had any fever or chills. She denies headache or stiff neck or cough. Her gallbladder has been taken out years ago, she has also had a total hysterectomy. She has no lower abdominal discomfort.      Past Medical History:  Diagnosis Date  . Hypertension     There are no active problems to display for this patient.   Past Surgical History:  Procedure Laterality Date  . HERNIA REPAIR  2013   abdominal.    Prior to Admission medications   Medication Sig Start Date End Date Taking? Authorizing Provider  amLODipine (NORVASC) 5 MG tablet Take 5 mg by mouth daily. 07/15/16  Yes [provider]  lisinopril-hydrochlorothiazide (PRINZIDE,ZESTORETIC) 20-25 MG tablet Take 1 tablet by mouth daily. 06/27/16  Yes [provider]  metFORMIN (GLUCOPHAGE) 500 MG tablet Take 500 mg by mouth 2 (two) times daily. 06/27/16  Yes [provider]  rosuvastatin (CRESTOR) 40 MG tablet Take 40 mg by mouth daily. 05/25/16  Yes [provider]  methocarbamol (ROBAXIN) 500 MG tablet Take 1-2 tablets (500-1,000 mg total) by mouth every 6 (six) hours as needed. 11/08/14   Trixie DredgeWest, Emily, PA-C    Allergies Patient has no known allergies.  No family history on file.  Social History Social History   Substance Use Topics  . Smoking status: Former Smoker    Types: Cigarettes  . Smokeless tobacco: Never Used  . Alcohol use Yes    Review of Systems Constitutional: No fever/chills Eyes: No visual changes. ENT: No sore throat. No stiff neck no neck pain Cardiovascular: Denies chest pain. Respiratory: Denies shortness of breath. Gastrointestinal:   Positive vomiting.  No diarrhea.  No constipation. Genitourinary: Negative for dysuria. Musculoskeletal: Negative lower extremity swelling Skin: Negative for rash. Neurological: Negative for severe headaches, focal weakness or numbness. 10-point ROS otherwise negative.  ____________________________________________   PHYSICAL EXAM:  VITAL SIGNS: ED Triage Vitals  Enc Vitals Group     BP 07/23/16 1141 (!) 152/104     Pulse Rate 07/23/16 1141 78     Resp 07/23/16 1141 16     Temp 07/23/16 1141 98.1 F (36.7 C)     Temp Source 07/23/16 1141 Oral     SpO2 07/23/16 1141 96 %     Weight 07/23/16 1137 180 lb (81.6 kg)     Height 07/23/16 1137 5\' 6"  (1.676 m)     Head Circumference --      Peak Flow --      Pain Score --      Pain Loc --      Pain Edu? --      Excl. in GC? --     Constitutional: Alert and oriented. Well appearing and in no acute distress But clearly she feels anxious and she has vomiting Eyes: Conjunctivae are normal. PERRL. EOMI. Head: Atraumatic. Nose: No congestion/rhinnorhea.  Mouth/Throat: Mucous membranes are moist.  Oropharynx non-erythematous. Neck: No stridor.   Nontender with no meningismus Cardiovascular: Normal rate, regular rhythm. Grossly normal heart sounds.  Good peripheral circulation. Respiratory: Normal respiratory effort.  No retractions. Lungs CTAB. Abdominal: Soft and positive mild epigastric tenderness. No distention. No guarding no rebound Back:  There is no focal tenderness or step off.  there is no midline tenderness there are no lesions noted. there is no CVA  tenderness Musculoskeletal: No lower extremity tenderness, no upper extremity tenderness. No joint effusions, no DVT signs strong distal pulses no edema Neurologic:  Normal speech and language. No gross focal neurologic deficits are appreciated.  Skin:  Skin is warm, dry and intact. No rash noted. Psychiatric: Mood and affect are normal. Speech and behavior are normal.  ____________________________________________   LABS (all labs ordered are listed, but only abnormal results are displayed)  Labs Reviewed  COMPREHENSIVE METABOLIC PANEL - Abnormal; Notable for the following:       Result Value   Glucose, Bld 147 (*)    All other components within normal limits  LIPASE, BLOOD  CBC  URINALYSIS, COMPLETE (UACMP) WITH MICROSCOPIC   ____________________________________________  EKG  I personally interpreted any EKGs ordered by me or triage Sinus rate 75 no acute ST elevation or acute ST depression normal axis no significant ST changes noted acute ischemia ____________________________________________  RADIOLOGY  I reviewed any imaging ordered by me or triage that were performed during my shift and, if possible, patient and/or family made aware of any abnormal findings. ____________________________________________   PROCEDURES  Procedure(s) performed: None  Procedures  Critical Care performed: None  ____________________________________________   INITIAL IMPRESSION / ASSESSMENT AND PLAN / ED COURSE  Pertinent labs & imaging results that were available during my care of the patient were reviewed by me and considered in my medical decision making (see chart for details).  Patient here with epigastric abdominal discomfort associated with vomiting, abdomen is nonsurgical, no evidence of appendicitis very low suspicion for SBO, white count is reassuring hemoglobin is reassuring lipase is reassuring that her infection tests are reassuring EKG is reassuring I don't think this is  referred cardiac pain with persistent vomiting for 12 hours and no chest pain, her biggest issue is her ongoing nausea. I did give her Haldol. At this time, 1:08 PM, she is pain-free, not nauseated lying comfortable in the bed and feels she states 100% better. Haldol as an excellent antiemetics. We will continue to observe her in the emergency department we will get a urine sample. Given her history of abdominal surgeries in the low suspicion for SBO I will get an acute abdominal series. I don't think CT scan is warranted given his clinical pressure. We will continue to observe her in the emergency room  ----------------------------------------- 3:29 PM on 07/23/2016 -----------------------------------------  Signed out to dr. Lamont Snowball,     ____________________________________________   FINAL CLINICAL IMPRESSION(S) / ED DIAGNOSES  Final diagnoses:  None      This chart was dictated using voice recognition software.  Despite best efforts to proofread,  errors can occur which can change meaning.      Jeanmarie Plant, MD 07/23/16 1309    Jeanmarie Plant, MD 07/23/16 [phone]

## 2016-07-23 NOTE — ED Triage Notes (Signed)
PT came to ED via EMS c/o n/v starting last night around midnight. Pt given fluid by EMS and fentanyl for abdominal cramping. VS stable.

## 2016-07-23 NOTE — H&P (Signed)
Patient ID: Mary Montes, female   DOB: [birth year], 61 y.o.   MRN: [id number]  CC: Abdominal pain  HPI Mary Montes is a 61 y.o. female who presents emergency department for evaluation of abdominal pain with associated nausea and vomiting. Patient is a poor historian but also very sedated and had to be awoken multiple times during the history. She states this has never happened before. The pain, nausea, vomiting all started same time earlier today. She states she ate earlier today but threw it up. She had a normal bowel movement earlier today. She denies any fevers, chills, chest pain, shortness of breath. She has had multiple prior surgeries but states they were all done in [place].  HPI  Past Medical History:  Diagnosis Date  . Asthma   . Hypertension     Past Surgical History:  Procedure Laterality Date  . HERNIA REPAIR  2013   abdominal.    Family history: Patient denies any known family history of cancer, diabetes, heart disease.  Social History Social History  Substance Use Topics  . Smoking status: Former Smoker    Types: Cigarettes  . Smokeless tobacco: Never Used  . Alcohol use Yes    No Known Allergies  Current Facility-Administered Medications  Medication Dose Route Frequency Provider Last Rate Last Dose  . gi cocktail (Maalox,Lidocaine,Donnatal)  30 mL Oral Once Jeanmarie Plant, MD       Current Outpatient Prescriptions  Medication Sig Dispense Refill  . amLODipine (NORVASC) 5 MG tablet Take 5 mg by mouth daily.    Marland Kitchen lisinopril-hydrochlorothiazide (PRINZIDE,ZESTORETIC) 20-25 MG tablet Take 1 tablet by mouth daily.    . metFORMIN (GLUCOPHAGE) 500 MG tablet Take 500 mg by mouth 2 (two) times daily.    . rosuvastatin (CRESTOR) 40 MG tablet Take 40 mg by mouth daily.    . methocarbamol (ROBAXIN) 500 MG tablet Take 1-2 tablets (500-1,000 mg total) by mouth every 6 (six) hours as needed. 15 tablet 0     Review of Systems A Multi-point review of  systems was asked and was negative except for the findings documented in the history of present illness  Physical Exam Blood pressure (!) 145/94, pulse 73, temperature 98.1 F (36.7 C), temperature source Oral, resp. rate 14, height 5\' 6"  (1.676 m), weight 81.6 kg (180 lb), SpO2 95 %. CONSTITUTIONAL: No acute distress. EYES: Pupils are equal, round, and reactive to light, Sclera are non-icteric. EARS, NOSE, MOUTH AND THROAT: The oropharynx is clear. The oral mucosa is pink and moist. Hearing is intact to voice. LYMPH NODES:  Lymph nodes in the neck are normal. RESPIRATORY:  Lungs are clear. There is normal respiratory effort, with equal breath sounds bilaterally, and without pathologic use of accessory muscles. CARDIOVASCULAR: Heart is regular without murmurs, gallops, or rubs. GI: The abdomen is soft, nontender, and nondistended. There are no palpable masses. There is no hepatosplenomegaly. There are normal bowel sounds in all quadrants. GU: Rectal deferred.   MUSCULOSKELETAL: Normal muscle strength and tone. No cyanosis or edema.   SKIN: Turgor is good and there are no pathologic skin lesions or ulcers. NEUROLOGIC: Motor and sensation is grossly normal. Cranial nerves are grossly intact. PSYCH:  Oriented to person, place and time. Affect is normal.  Data Reviewed Images and labs reviewed. Labs are all within normal limits. CT scan of the abdomen shows dilated loops of small bowel with evidence of equalization consistent with a small bowel obstruction. I have personally reviewed the patient's imaging, laboratory findings and  medical records.    Assessment    Small bowel obstruction    Plan    61 year old female with a small bowel obstruction. The presence of equalization of the small bowel speaks to this being chronic in nature. Discussed with the patient the diagnosis. As well as the treatment including admission, bowel rest, IV hydration. Given that her nausea and vomiting appear to  have resolved at this point discussed that an NG tube be placed only if she had returning of nausea and vomiting. Serial exams will be performed. She agrees with plan for admission.     Time spent with the patient was 50 minutes, with more than 50% of the time spent in face-to-face education, counseling and care coordination.     Ricarda Frameharles Rochella Benner, MD FACS General Surgeon 07/23/2016, 4:34 PM

## 2016-07-24 ENCOUNTER — Inpatient Hospital Stay: Payer: Federal, State, Local not specified - PPO

## 2016-07-24 LAB — BASIC METABOLIC PANEL
Anion gap: 6 (ref 5–15)
BUN: 17 mg/dL (ref 6–20)
CALCIUM: 8 mg/dL — AB (ref 8.9–10.3)
CO2: 28 mmol/L (ref 22–32)
Chloride: 106 mmol/L (ref 101–111)
Creatinine, Ser: 0.71 mg/dL (ref 0.44–1.00)
GFR calc Af Amer: >60 mL/min (ref >60)
GLUCOSE: 121 mg/dL — AB (ref 65–99)
Potassium: 3.4 mmol/L — ABNORMAL LOW (ref 3.5–5.1)
Sodium: 140 mmol/L (ref 135–145)

## 2016-07-24 LAB — MAGNESIUM: Magnesium: 1.7 mg/dL (ref 1.7–2.4)

## 2016-07-24 LAB — CBC
HEMATOCRIT: 37.2 % (ref 35.0–47.0)
Hemoglobin: 12.6 g/dL (ref 12.0–16.0)
MCH: 29.1 pg (ref 26.0–34.0)
MCHC: 33.9 g/dL (ref 32.0–36.0)
MCV: 85.7 fL (ref 80.0–100.0)
Platelets: 137 10*3/uL — ABNORMAL LOW (ref 150–440)
RBC: 4.34 MIL/uL (ref 3.80–5.20)
RDW: 14.1 % (ref 11.5–14.5)
WBC: 4.8 10*3/uL (ref 3.6–11.0)

## 2016-07-24 LAB — PHOSPHORUS: PHOSPHORUS: 3.9 mg/dL (ref 2.5–4.6)

## 2016-07-24 LAB — GLUCOSE, CAPILLARY
GLUCOSE-CAPILLARY: 77 mg/dL (ref 65–99)
Glucose-Capillary: 85 mg/dL (ref 65–99)
Glucose-Capillary: 85 mg/dL (ref 65–99)
Glucose-Capillary: 90 mg/dL (ref 65–99)

## 2016-07-24 IMAGING — DX DG ABD PORTABLE 2V
3 series · 3 of 3 positions shown · non-contrast
Comparison: [DATE]; CT abdomen and pelvis - [DATE]

CLINICAL DATA: Small-bowel obstruction.  Post NG tube placement.

EXAM:
PORTABLE ABDOMEN - 2 VIEW

[abdomen erect]
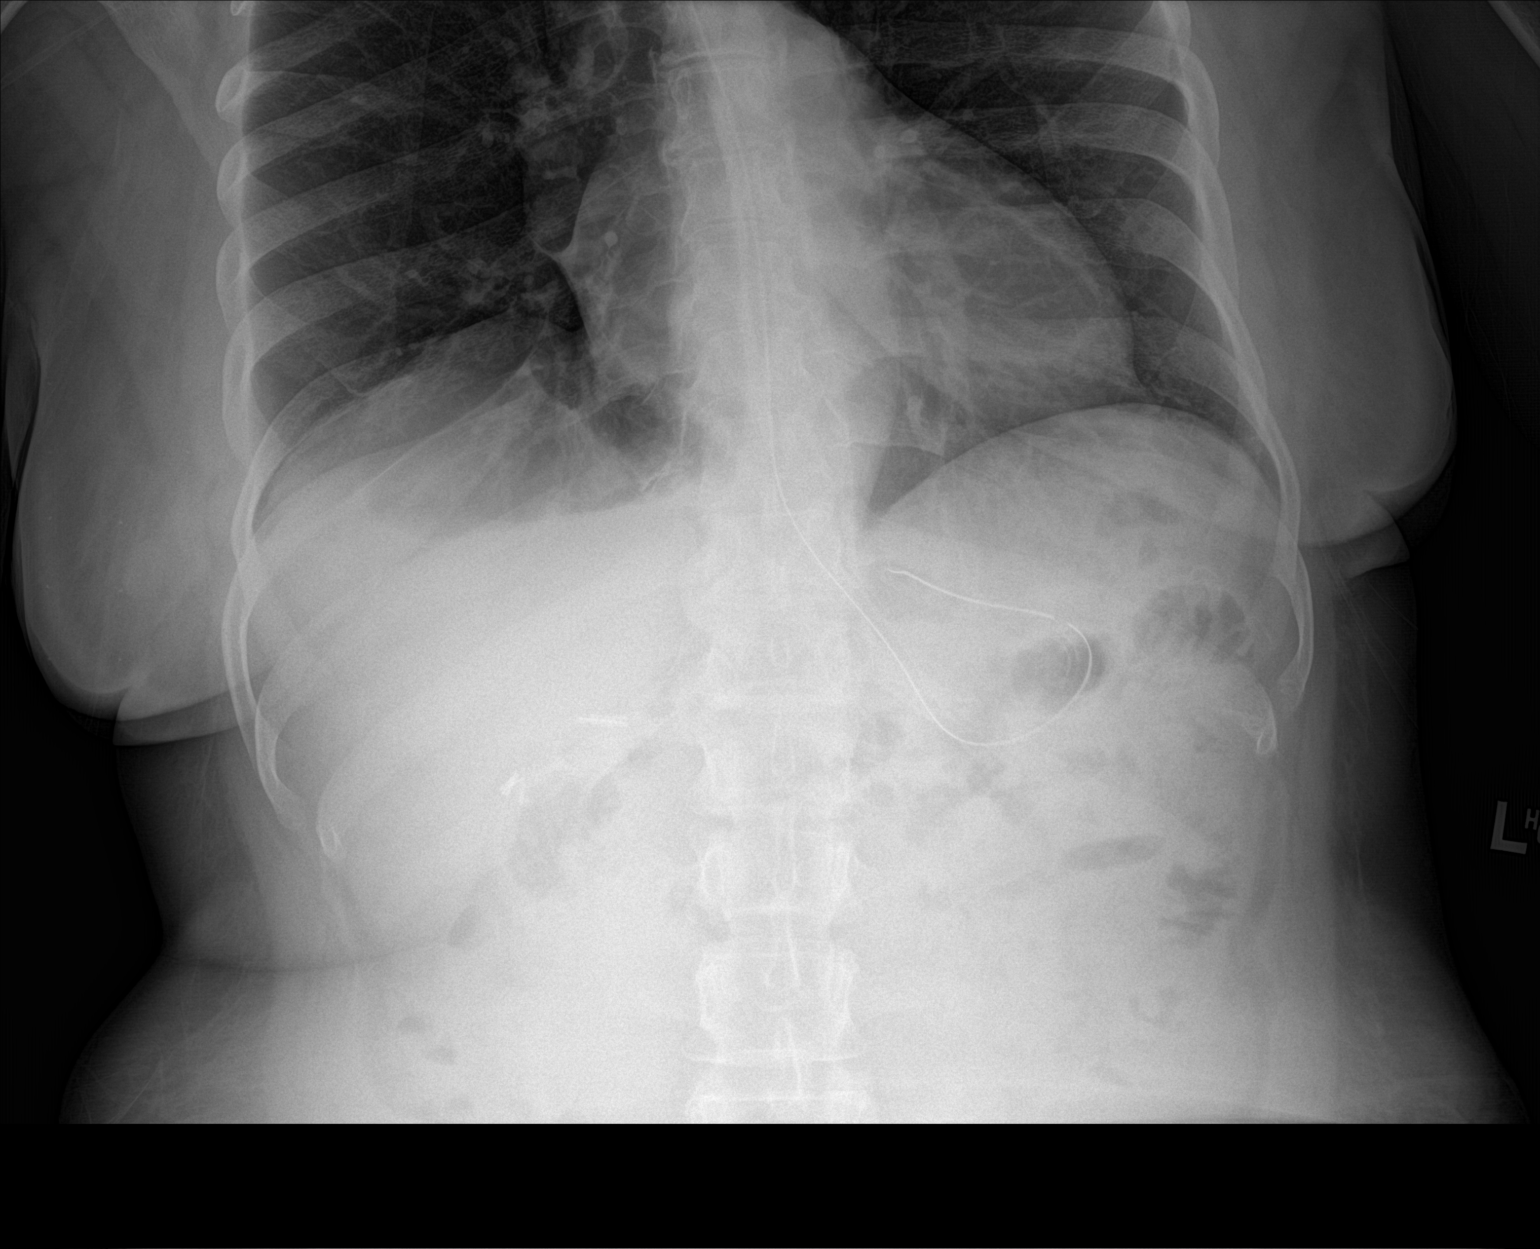

[abdomen supine (1 of 2)]
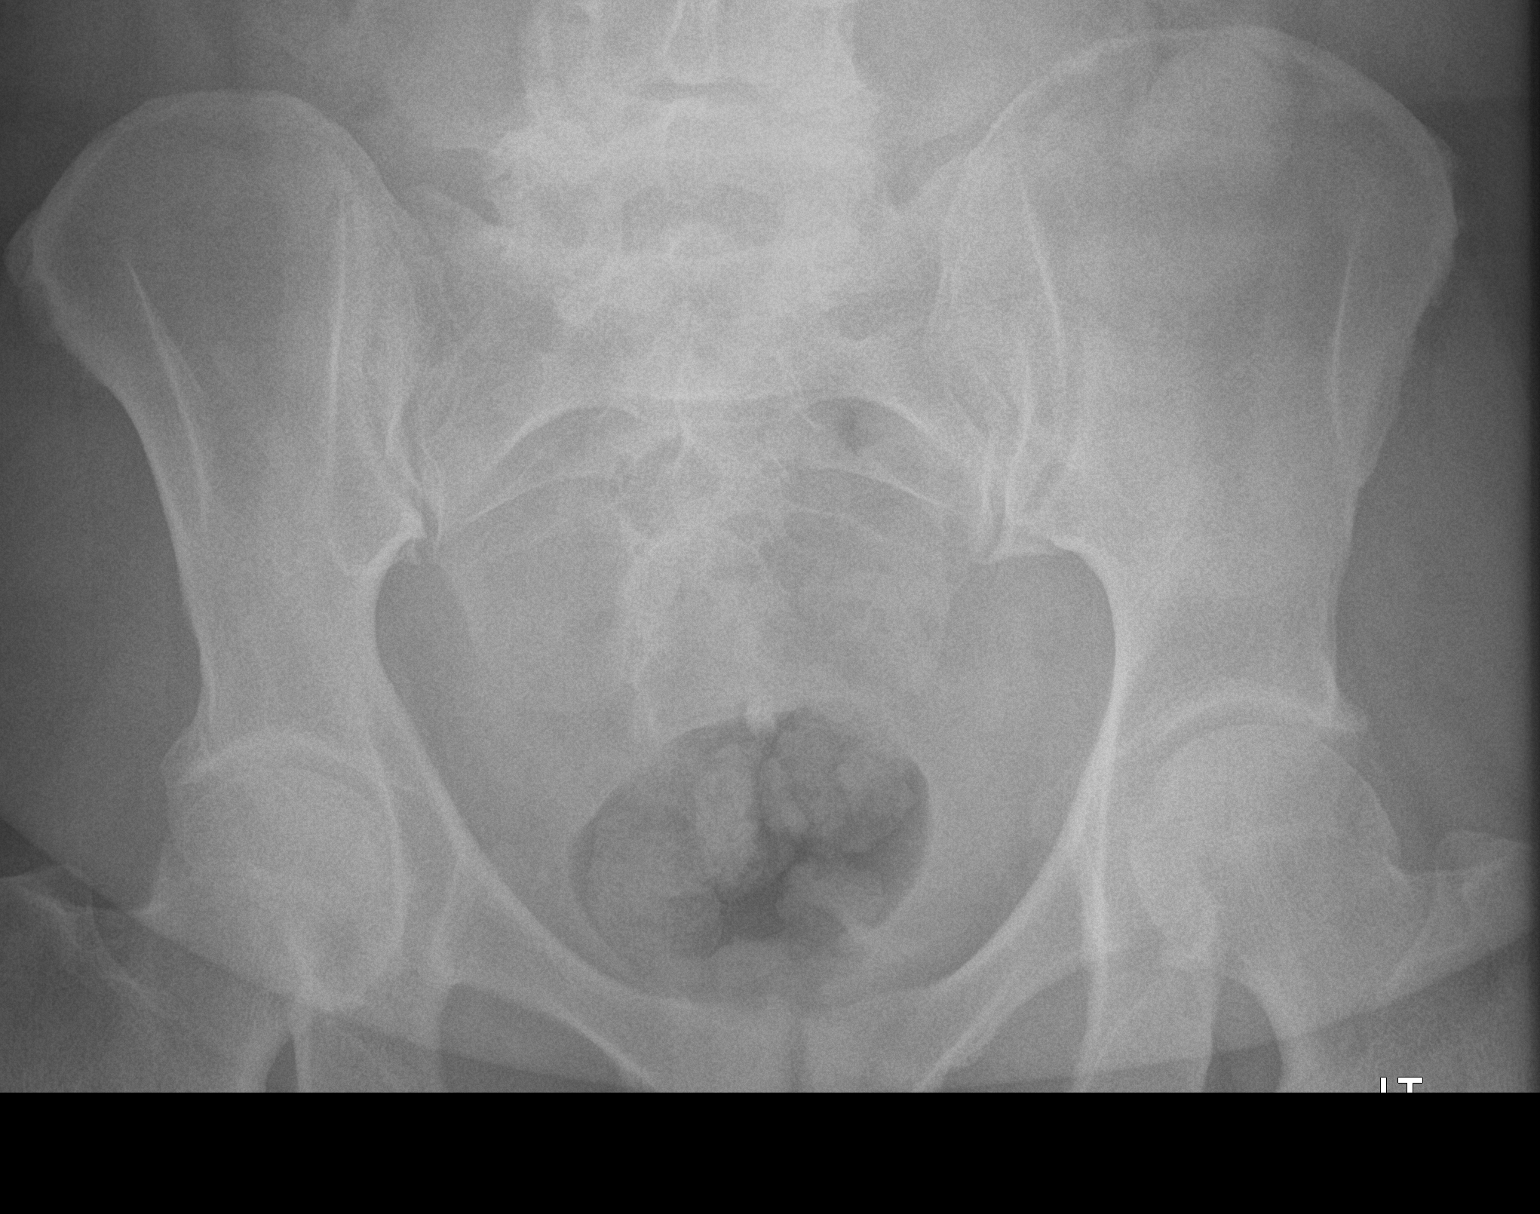

[abdomen supine (2 of 2)]
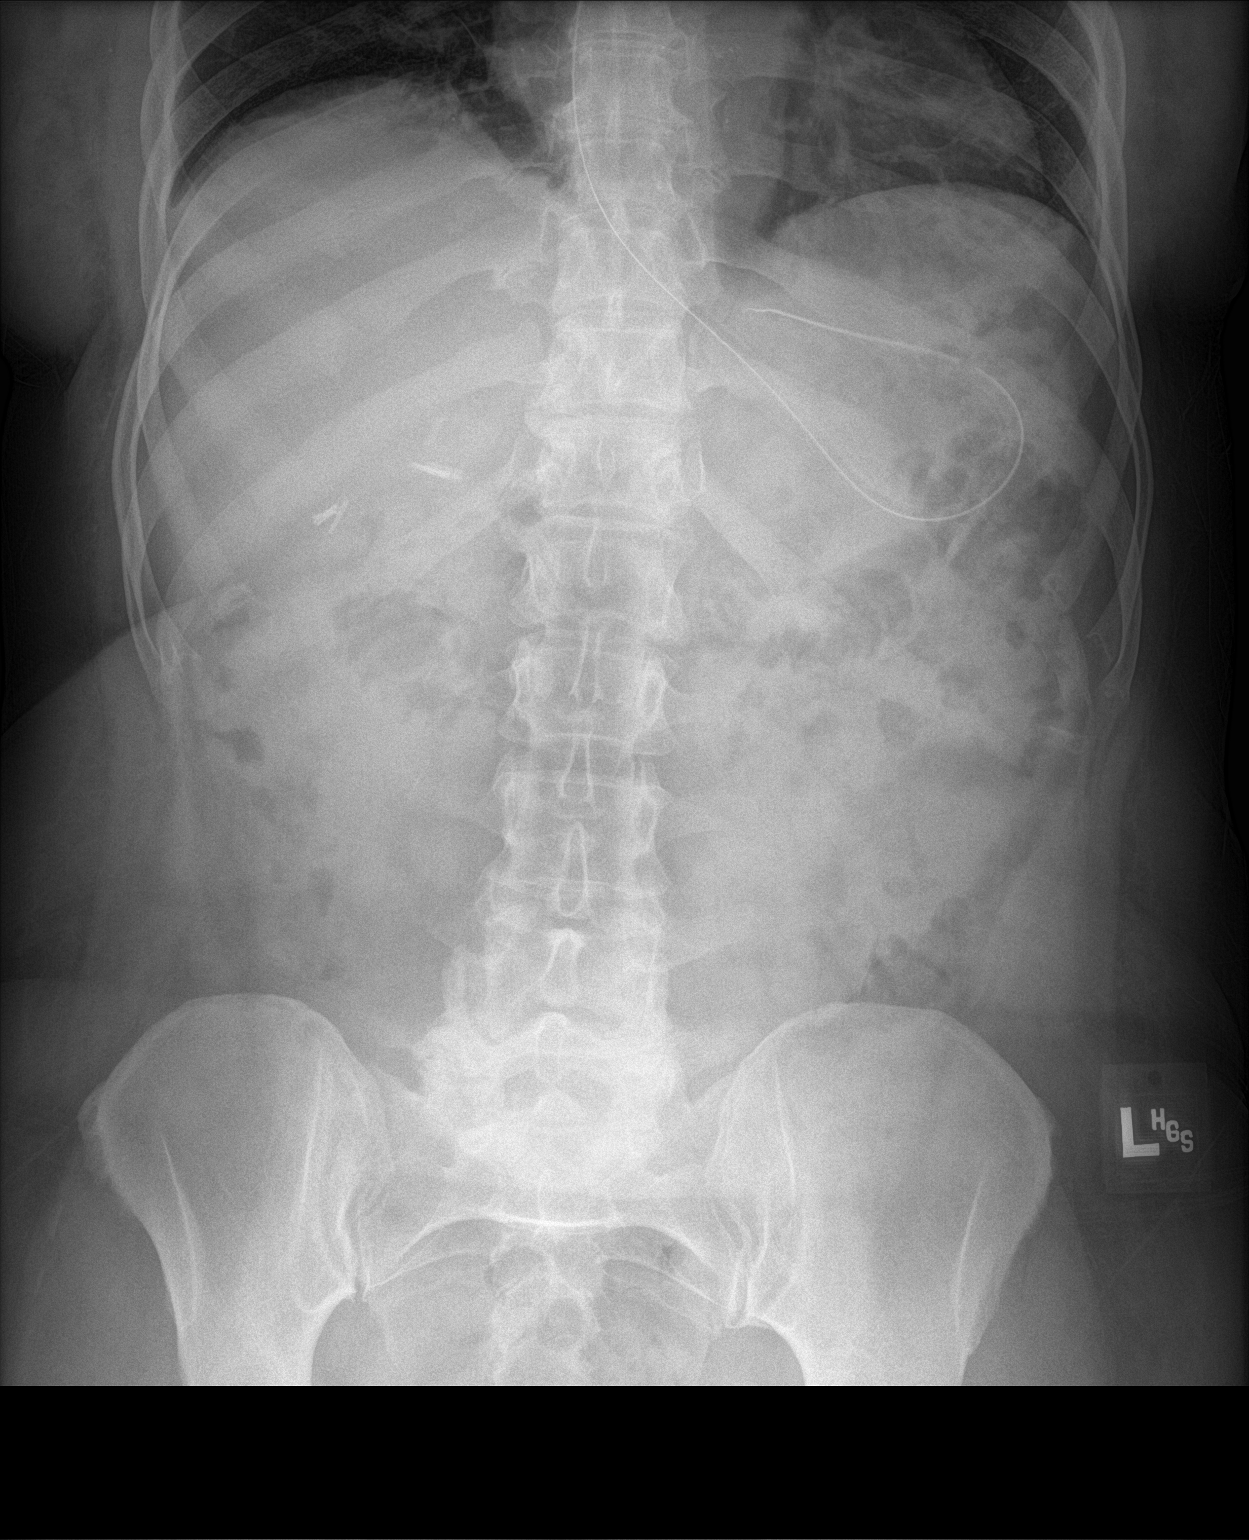

[3 of 3 positions shown; findings below may reference images not displayed]

FINDINGS: Enteric tube tip and side port project over the location of the
gastric fundus.

Paucity of bowel gas without evidence of enteric obstruction. No
pneumoperitoneum, pneumatosis or portal venous gas.

Post cholecystectomy.

No definitive abnormal intra-abdominal calcifications.

Limited visualization of the lower thorax is negative for focal
airspace opacity.

Bilateral facet degenerative change within the lower lumbar spine is
suspected though incompletely evaluated.
IMPRESSION: Paucity of bowel gas without evidence of enteric obstruction,
however note, dilated loops of small bowel were demonstrated on
preceding abdominal CT though poorly demonstrated on preceding
abdominal radiograph secondary to majority of dilated loops of small
bowel containing fluid and feculent material. Clinical correlation
is advised.

## 2016-07-24 NOTE — Progress Notes (Signed)
CC: Small bowel obstruction Subjective: Patient reports feeling a lot better since admission. She is much more awake and alert this morning from yesterday afternoon. Denies any flatus or bowel function yet though. Her abdominal pain, nausea, vomiting have completely resolved with the NG tube decompression.  Objective: Vital signs in last 24 hours: Temp:  [98.1 F (36.7 C)-98.4 F (36.9 C)] 98.1 F (36.7 C) (05/27 6962) Pulse Rate:  [73-88] 82 (05/27 0632) Resp:  [14-18] 16 (05/27 9528) BP: (129-173)/(85-104) 129/89 (05/27 4132) SpO2:  [95 %-99 %] 99 % (05/27 4401) Weight:  [81.6 kg (180 lb)-87.3 kg (192 lb 8 oz)] 87.3 kg (192 lb 8 oz) (05/26 1732) Last BM Date: 07/23/16  Intake/Output from previous day: 05/26 0701 - 05/27 0700 In: 0  Out: 220 [Emesis/NG output:220] Intake/Output this shift: No intake/output data recorded.  Physical exam:  Gen.: No acute distress Chest: Clear to sedation Heart: Regular rhythm Abdomen: Soft, nontender, nondistended  Lab Results: CBC   Recent Labs  07/23/16 1138 07/24/16 0433  WBC 10.6 4.8  HGB 13.3 12.6  HCT 39.4 37.2  PLT 169 137*   BMET  Recent Labs  07/23/16 1138 07/24/16 0433  NA 139 140  K 3.5 3.4*  CL 104 106  CO2 28 28  GLUCOSE 147* 121*  BUN 16 17  CREATININE 0.71 0.71  CALCIUM 9.3 8.0*   PT/INR No results for input(s): LABPROT, INR in the last 72 hours. ABG No results for input(s): PHART, HCO3 in the last 72 hours.  Invalid input(s): PCO2, PO2  Studies/Results: Ct Abdomen Pelvis W Contrast  Result Date: 07/23/2016 CLINICAL DATA:  Nausea and vomiting for several hours EXAM: CT ABDOMEN AND PELVIS WITH CONTRAST TECHNIQUE: Multidetector CT imaging of the abdomen and pelvis was performed using the standard protocol following bolus administration of intravenous contrast. CONTRAST:  ISOVUE-300 IOPAMIDOL (ISOVUE-300) INJECTION 61% COMPARISON:  None. FINDINGS: Lower chest: Lung bases are free of acute infiltrate  or sizable effusion. Small hiatal hernia is noted. Hepatobiliary: No focal liver abnormality is seen. Status post cholecystectomy. No biliary dilatation. Pancreas: Unremarkable. No pancreatic ductal dilatation or surrounding inflammatory changes. Spleen: Normal in size without focal abnormality. Adrenals/Urinary Tract: Adrenal glands are within normal limits. The kidneys demonstrate tiny nonobstructing renal stones on the left. No obstructive changes are seen on the right. Bladder is partially distended. Stomach/Bowel: There multiple dilated loops of small bowel in the mid jejunum extending distally into the proximal ileum. The most proximal aspect of the chin and distal most aspect of the ileum are within normal limits. Some fecalization of small bowel contents is noted. The transition point is noted in the anterior mid abdomen best seen on the coronal imaging (image 20 of series 5). The appendix is unremarkable. More distal colon is within normal limits. Vascular/Lymphatic: Aortic atherosclerosis. No enlarged abdominal or pelvic lymph nodes. Reproductive: Status post hysterectomy. No adnexal masses. Other: Small anterior abdominal hernias are identified containing fat. Some mild inflammatory change is noted surrounding the inferior hernia just above the umbilicus. This is best visualized on image number 57 of series 6. This is adjacent to the transition zone of the small bowel dilatation and may contribute some scarring/adhesions contributing to the small-bowel obstruction. Mild free fluid is noted within the abdomen and pelvis. Musculoskeletal: Degenerative changes of lumbar spine are noted. IMPRESSION: Small-bowel obstruction which appears likely related to some adhesions along the anterior aspect of the abdomen. A small fat containing abdominal wall hernia is noted associated with these changes.  Nonobstructing left renal stones. No other focal abnormality is noted. Electronically Signed   By: Alcide CleverMark  Lukens M.D.    On: 07/23/2016 15:34   Dg Abdomen Acute W/chest  Result Date: 07/23/2016 CLINICAL DATA:  Nausea and vomiting.  Abdominal pain. EXAM: DG ABDOMEN ACUTE W/ 1V CHEST COMPARISON:  None. FINDINGS: There is no evidence of dilated bowel loops or free intraperitoneal air. No radiopaque calculi or other significant radiographic abnormality is seen. Heart size and mediastinal contours are within normal limits. Both lungs are clear. IMPRESSION: Negative abdominal radiographs.  No acute cardiopulmonary disease. Electronically Signed   By: Gerome Samavid  Williams III M.D   On: 07/23/2016 13:37   Dg Abd Portable 1v  Result Date: 07/23/2016 CLINICAL DATA:  NG tube placement EXAM: PORTABLE ABDOMEN - 1 VIEW COMPARISON:  None. FINDINGS: Nasogastric tube appears appropriately positioned in the stomach with tip directed towards the stomach fundus. Visualized bowel gas pattern is nonobstructive. Visualized lung bases appear clear. IMPRESSION: Nasogastric tube appears appropriately positioned in the stomach. Electronically Signed   By: Bary RichardStan  Maynard M.D.   On: 07/23/2016 18:51   Dg Abd Portable 2v  Result Date: 07/24/2016 CLINICAL DATA:  Small-bowel obstruction.  Post NG tube placement. EXAM: PORTABLE ABDOMEN - 2 VIEW COMPARISON:  07/23/2016; CT abdomen and pelvis - 07/23/2016 FINDINGS: Enteric tube tip and side port project over the location of the gastric fundus. Paucity of bowel gas without evidence of enteric obstruction. No pneumoperitoneum, pneumatosis or portal venous gas. Post cholecystectomy. No definitive abnormal intra-abdominal calcifications. Limited visualization of the lower thorax is negative for focal airspace opacity. Bilateral facet degenerative change within the lower lumbar spine is suspected though incompletely evaluated. IMPRESSION: Paucity of bowel gas without evidence of enteric obstruction, however note, dilated loops of small bowel were demonstrated on preceding abdominal CT though poorly demonstrated on  preceding abdominal radiograph secondary to majority of dilated loops of small bowel containing fluid and feculent material. Clinical correlation is advised. Electronically Signed   By: Simonne ComeJohn  Watts M.D.   On: 07/24/2016 08:44    Anti-infectives: Anti-infectives    None      Assessment/Plan:  61 year old female admitted with a small bowel obstruction. Had episode of nausea and vomiting during admission process and an NG tube was placed per when necessary order. She feels much better clinically. Discussed with the patient we will continue her NG tube decompression today. Encourage ambulation and incentive spirometer usage.  Takeyah Wieman T. Tonita CongWoodham, MD, [facility] L[facility] General Surgeon [facility]  Day [facility] [phone](7a-7p) [phone] Night [facility] [phone](7p-7a) [phone] 07/24/2016

## 2016-07-25 LAB — HIV ANTIBODY (ROUTINE TESTING W REFLEX): HIV Screen 4th Generation wRfx: NONREACTIVE

## 2016-07-25 LAB — GLUCOSE, CAPILLARY
GLUCOSE-CAPILLARY: 95 mg/dL (ref 65–99)
GLUCOSE-CAPILLARY: 87 mg/dL (ref 65–99)
Glucose-Capillary: 100 mg/dL — ABNORMAL HIGH (ref 65–99)
Glucose-Capillary: 108 mg/dL — ABNORMAL HIGH (ref 65–99)
Glucose-Capillary: 112 mg/dL — ABNORMAL HIGH (ref 65–99)
Glucose-Capillary: 91 mg/dL (ref 65–99)

## 2016-07-25 NOTE — Progress Notes (Addendum)
Verbal order from on call surgeon to D/C CBG Q4 order. Highest CBG was 133 throughout hospitalization this far. Will continue to monitor pt.   Clarion Mooneyhan [place] Oil[place]itten[place]

## 2016-07-25 NOTE — Progress Notes (Signed)
CC: SBO   Subjective: Doing well passing flatus. No abd pain  Objective: Vital signs in last 24 hours: Temp:  [98.2 F (36.8 C)-99.6 F (37.6 C)] 98.7 F (37.1 C) (05/28 0430) Pulse Rate:  [72-88] 72 (05/28 0430) Resp:  [16] 16 (05/28 0430) BP: (116-145)/(79-84) 136/84 (05/28 0430) SpO2:  [95 %-99 %] 95 % (05/28 0430) Last BM Date: 07/23/16  Intake/Output from previous day: 05/27 0701 - 05/28 0700 In: 5800 [I.V.:5625; NG/GT:175] Out: 550 [Urine:400; Emesis/NG output:150] Intake/Output this shift: No intake/output data recorded.  Physical exam: NAD, in good spiritis Abd: soft, NT, no peritonitis Ext: well perfused, no edema Neuro: awake and alert. No motor or sens deficits  Lab Results: CBC   Recent Labs  07/23/16 1138 07/24/16 0433  WBC 10.6 4.8  HGB 13.3 12.6  HCT 39.4 37.2  PLT 169 137*   BMET  Recent Labs  07/23/16 1138 07/24/16 0433  NA 139 140  K 3.5 3.4*  CL 104 106  CO2 28 28  GLUCOSE 147* 121*  BUN 16 17  CREATININE 0.71 0.71  CALCIUM 9.3 8.0*   PT/INR No results for input(s): LABPROT, INR in the last 72 hours. ABG No results for input(s): PHART, HCO3 in the last 72 hours.  Invalid input(s): PCO2, PO2  Studies/Results: Ct Abdomen Pelvis W Contrast  Result Date: 07/23/2016 CLINICAL DATA:  Nausea and vomiting for several hours EXAM: CT ABDOMEN AND PELVIS WITH CONTRAST TECHNIQUE: Multidetector CT imaging of the abdomen and pelvis was performed using the standard protocol following bolus administration of intravenous contrast. CONTRAST:  ISOVUE-300 IOPAMIDOL (ISOVUE-300) INJECTION 61% COMPARISON:  None. FINDINGS: Lower chest: Lung bases are free of acute infiltrate or sizable effusion. Small hiatal hernia is noted. Hepatobiliary: No focal liver abnormality is seen. Status post cholecystectomy. No biliary dilatation. Pancreas: Unremarkable. No pancreatic ductal dilatation or surrounding inflammatory changes. Spleen: Normal in size without  focal abnormality. Adrenals/Urinary Tract: Adrenal glands are within normal limits. The kidneys demonstrate tiny nonobstructing renal stones on the left. No obstructive changes are seen on the right. Bladder is partially distended. Stomach/Bowel: There multiple dilated loops of small bowel in the mid jejunum extending distally into the proximal ileum. The most proximal aspect of the chin and distal most aspect of the ileum are within normal limits. Some fecalization of small bowel contents is noted. The transition point is noted in the anterior mid abdomen best seen on the coronal imaging (image 20 of series 5). The appendix is unremarkable. More distal colon is within normal limits. Vascular/Lymphatic: Aortic atherosclerosis. No enlarged abdominal or pelvic lymph nodes. Reproductive: Status post hysterectomy. No adnexal masses. Other: Small anterior abdominal hernias are identified containing fat. Some mild inflammatory change is noted surrounding the inferior hernia just above the umbilicus. This is best visualized on image number 57 of series 6. This is adjacent to the transition zone of the small bowel dilatation and may contribute some scarring/adhesions contributing to the small-bowel obstruction. Mild free fluid is noted within the abdomen and pelvis. Musculoskeletal: Degenerative changes of lumbar spine are noted. IMPRESSION: Small-bowel obstruction which appears likely related to some adhesions along the anterior aspect of the abdomen. A small fat containing abdominal wall hernia is noted associated with these changes. Nonobstructing left renal stones. No other focal abnormality is noted. Electronically Signed   By: Alcide Clever M.D.   On: 07/23/2016 15:34   Dg Abdomen Acute W/chest  Result Date: 07/23/2016 CLINICAL DATA:  Nausea and vomiting.  Abdominal pain. EXAM:  DG ABDOMEN ACUTE W/ 1V CHEST COMPARISON:  None. FINDINGS: There is no evidence of dilated bowel loops or free intraperitoneal air. No  radiopaque calculi or other significant radiographic abnormality is seen. Heart size and mediastinal contours are within normal limits. Both lungs are clear. IMPRESSION: Negative abdominal radiographs.  No acute cardiopulmonary disease. Electronically Signed   By: Gerome Samavid  Williams III M.D   On: 07/23/2016 13:37   Dg Abd Portable 1v  Result Date: 07/23/2016 CLINICAL DATA:  NG tube placement EXAM: PORTABLE ABDOMEN - 1 VIEW COMPARISON:  None. FINDINGS: Nasogastric tube appears appropriately positioned in the stomach with tip directed towards the stomach fundus. Visualized bowel gas pattern is nonobstructive. Visualized lung bases appear clear. IMPRESSION: Nasogastric tube appears appropriately positioned in the stomach. Electronically Signed   By: Bary RichardStan  Maynard M.D.   On: 07/23/2016 18:51   Dg Abd Portable 2v  Result Date: 07/24/2016 CLINICAL DATA:  Small-bowel obstruction.  Post NG tube placement. EXAM: PORTABLE ABDOMEN - 2 VIEW COMPARISON:  07/23/2016; CT abdomen and pelvis - 07/23/2016 FINDINGS: Enteric tube tip and side port project over the location of the gastric fundus. Paucity of bowel gas without evidence of enteric obstruction. No pneumoperitoneum, pneumatosis or portal venous gas. Post cholecystectomy. No definitive abnormal intra-abdominal calcifications. Limited visualization of the lower thorax is negative for focal airspace opacity. Bilateral facet degenerative change within the lower lumbar spine is suspected though incompletely evaluated. IMPRESSION: Paucity of bowel gas without evidence of enteric obstruction, however note, dilated loops of small bowel were demonstrated on preceding abdominal CT though poorly demonstrated on preceding abdominal radiograph secondary to majority of dilated loops of small bowel containing fluid and feculent material. Clinical correlation is advised. Electronically Signed   By: Simonne ComeJohn  Watts M.D.   On: 07/24/2016 08:44    Anti-infectives: Anti-infectives    None       Assessment/Plan: Resolving partial SBO Clamp  NGT if no N/V may DC and start clears Mobilize No surgical intervention  Sterling Bigiego Meredyth Hornung, MD, [facility] 07/25/2016

## 2016-07-26 NOTE — Progress Notes (Signed)
Ride arrived and patient discharged via wheelchair escorted by nursing staff.

## 2016-07-26 NOTE — Care Management (Signed)
No discharge needs identified by members of the care team 

## 2016-07-26 NOTE — Progress Notes (Signed)
07/26/2016 8:56 AM  BP 108/66 (BP Location: Right Arm)   Pulse 67   Temp 98.7 F (37.1 C) (Oral)   Resp 18   Ht 5\' 6"  (1.676 m)   Wt 87.3 kg (192 lb 8 oz)   SpO2 97%   BMI 31.07 kg/m  Patient discharged per MD orders. Discharge instructions reviewed with patient and patient verbalized understanding. IV removed per policy. Patient called grand-daughter to come pick her up. Awaiting her to arrive for discharge.  Ron ParkerHerron, Martiza Speth D, RN

## 2016-07-26 NOTE — Progress Notes (Signed)
Pt did not express to RN that she felt any nausea or vomiting throughout the night. NG tube was removed on day shift on 07/25/2016. Pt expresses to RN that she is having bowel movements but has not allowed nursing staff to witness bowel movements. RN expresses to pt the importance of letting nursing staff witness bowel and urination for appropriate measuring of output.   Mary Corning [employer]

## 2016-07-26 NOTE — Discharge Summary (Signed)
  Patient ID: Mary LodgeFrancine Montes MRN: [id number] DOB/AGE: Dec 12, 1955 61 y.o.  Admit date: 07/23/2016 Discharge date: 07/26/2016   Discharge Diagnoses:  Active Problems:   SBO (small bowel obstruction) [facility](HCC)   Procedures:none  Hospital Course: 61 year old female with history of multiple abdominal operations presented with abdominal pain nausea and vomiting and workup revealed evidence of partial small bowel obstruction by imaging studies. She was admitted to the hospital and placed nothing by mouth and resuscitated with crystalloids and NG tube was placed. She clinically improved and were able to make very good progress. Her NG tube was removed and her serial abdominal exams continue to show clinical improvement. At the time of discharge she was ambulating, she was tolerating a regular diet and she was I'm bowel movements as well as flatus. Her vital signs were stable and she was afebrile. Physical exam showed a female in no acute distress, awake and alert. Abdomen: Soft, nontender nondistended no peritonitis. Extremities: No edema well perfused. Condition of patient at time of discharge is stable    Disposition: 01-Home or Self Care  Discharge Instructions    Call MD for:  difficulty breathing, headache or visual disturbances    Complete by:  As directed    Call MD for:  extreme fatigue    Complete by:  As directed    Call MD for:  hives    Complete by:  As directed    Call MD for:  persistant dizziness or light-headedness    Complete by:  As directed    Call MD for:  persistant nausea and vomiting    Complete by:  As directed    Call MD for:  redness, tenderness, or signs of infection (pain, swelling, redness, odor or green/yellow discharge around incision site)    Complete by:  As directed    Call MD for:  severe uncontrolled pain    Complete by:  As directed    Call MD for:  temperature >100.4    Complete by:  As directed    Diet - low sodium heart healthy    Complete by:  As  directed    Increase activity slowly    Complete by:  As directed    No wound care    Complete by:  As directed      Allergies as of 07/26/2016   No Known Allergies     Medication List    TAKE these medications   amLODipine 5 MG tablet Commonly known as:  NORVASC Take 5 mg by mouth daily.   lisinopril-hydrochlorothiazide 20-25 MG tablet Commonly known as:  PRINZIDE,ZESTORETIC Take 1 tablet by mouth daily.   metFORMIN 500 MG tablet Commonly known as:  GLUCOPHAGE Take 500 mg by mouth 2 (two) times daily.   methocarbamol 500 MG tablet Commonly known as:  ROBAXIN Take 1-2 tablets (500-1,000 mg total) by mouth every 6 (six) hours as needed.   rosuvastatin 40 MG tablet Commonly known as:  CRESTOR Take 40 mg by mouth daily.      Follow-up Information    Leafy RoPabon, Donovyn Guidice F, MD. Go on 08/15/2016.   Specialty:  General Surgery Why:  Monday at 10:00am for hospital follow-up Contact information: [address] [place] [address] [phone]            Sterling Bigiego Cristian Davitt, MD FACS

## 2016-07-28 ENCOUNTER — Telehealth: Payer: Self-pay

## 2016-07-28 NOTE — Telephone Encounter (Signed)
Called patient at this time to see how she was doing since being discharged from hospital on 5/29. Left a message for patient to call back with any questions or concerns she may have. Patient does have a follow up appointment on 6/18 with Dr. Everlene FarrierPAbon

## 2016-08-08 ENCOUNTER — Other Ambulatory Visit: Payer: Self-pay

## 2016-08-09 DIAGNOSIS — J45909 Unspecified asthma, uncomplicated: Secondary | ICD-10-CM | POA: Insufficient documentation

## 2016-08-09 DIAGNOSIS — I10 Essential (primary) hypertension: Secondary | ICD-10-CM | POA: Insufficient documentation

## 2016-08-09 DIAGNOSIS — E119 Type 2 diabetes mellitus without complications: Secondary | ICD-10-CM | POA: Insufficient documentation

## 2016-08-15 ENCOUNTER — Ambulatory Visit: Payer: Federal, State, Local not specified - PPO | Admitting: Surgery

## 2016-08-15 ENCOUNTER — Telehealth: Payer: Self-pay | Admitting: General Practice

## 2016-08-15 NOTE — Telephone Encounter (Signed)
Patient called back stating she does not want this appointment and it should have been cancelled. I did not reschedule the patient at this time

## 2016-08-15 NOTE — Telephone Encounter (Signed)
Left a message for patient to call the office to reschedule this no showed appointment on 08/15/16 with Dr. Everlene FarrierPabon. Please reschedule if patient calls.

## 2017-01-02 ENCOUNTER — Other Ambulatory Visit: Payer: Self-pay | Admitting: Family Medicine

## 2017-01-02 DIAGNOSIS — Z0000 Encounter for general adult medical examination without abnormal findings: Secondary | ICD-10-CM

## 2017-01-02 DIAGNOSIS — Z1231 Encounter for screening mammogram for malignant neoplasm of breast: Secondary | ICD-10-CM

## 2019-02-15 ENCOUNTER — Other Ambulatory Visit: Payer: Self-pay | Admitting: Family Medicine

## 2019-02-15 DIAGNOSIS — Z1231 Encounter for screening mammogram for malignant neoplasm of breast: Secondary | ICD-10-CM

## 2019-03-28 ENCOUNTER — Encounter: Payer: Self-pay | Admitting: Emergency Medicine

## 2019-03-28 ENCOUNTER — Emergency Department: Payer: Federal, State, Local not specified - PPO

## 2019-03-28 ENCOUNTER — Inpatient Hospital Stay
Admission: EM | Admit: 2019-03-28 | Discharge: 2019-04-02 | DRG: 472 | Disposition: A | Discharge status: Home / Self Care | Payer: Federal, State, Local not specified - PPO | Attending: Neurosurgery | Admitting: Neurosurgery

## 2019-03-28 ENCOUNTER — Inpatient Hospital Stay: Payer: Federal, State, Local not specified - PPO

## 2019-03-28 ENCOUNTER — Other Ambulatory Visit: Payer: Self-pay

## 2019-03-28 DIAGNOSIS — E119 Type 2 diabetes mellitus without complications: Secondary | ICD-10-CM | POA: Diagnosis present

## 2019-03-28 DIAGNOSIS — R609 Edema, unspecified: Secondary | ICD-10-CM | POA: Diagnosis present

## 2019-03-28 DIAGNOSIS — G9609 Other spinal cerebrospinal fluid leak: Secondary | ICD-10-CM | POA: Diagnosis present

## 2019-03-28 DIAGNOSIS — S142XXA Injury of nerve root of cervical spine, initial encounter: Secondary | ICD-10-CM | POA: Diagnosis present

## 2019-03-28 DIAGNOSIS — Z87891 Personal history of nicotine dependence: Secondary | ICD-10-CM

## 2019-03-28 DIAGNOSIS — S12100A Unspecified displaced fracture of second cervical vertebra, initial encounter for closed fracture: Secondary | ICD-10-CM | POA: Diagnosis present

## 2019-03-28 DIAGNOSIS — J45909 Unspecified asthma, uncomplicated: Secondary | ICD-10-CM | POA: Diagnosis present

## 2019-03-28 DIAGNOSIS — Y92410 Unspecified street and highway as the place of occurrence of the external cause: Secondary | ICD-10-CM

## 2019-03-28 DIAGNOSIS — Z20822 Contact with and (suspected) exposure to covid-19: Secondary | ICD-10-CM | POA: Diagnosis present

## 2019-03-28 DIAGNOSIS — S12401A Unspecified nondisplaced fracture of fifth cervical vertebra, initial encounter for closed fracture: Secondary | ICD-10-CM | POA: Diagnosis present

## 2019-03-28 DIAGNOSIS — M47812 Spondylosis without myelopathy or radiculopathy, cervical region: Secondary | ICD-10-CM | POA: Diagnosis present

## 2019-03-28 DIAGNOSIS — S12200A Unspecified displaced fracture of third cervical vertebra, initial encounter for closed fracture: Secondary | ICD-10-CM | POA: Diagnosis present

## 2019-03-28 DIAGNOSIS — M50323 Other cervical disc degeneration at C6-C7 level: Secondary | ICD-10-CM | POA: Diagnosis present

## 2019-03-28 DIAGNOSIS — S12000A Unspecified displaced fracture of first cervical vertebra, initial encounter for closed fracture: Secondary | ICD-10-CM | POA: Diagnosis present

## 2019-03-28 DIAGNOSIS — I10 Essential (primary) hypertension: Secondary | ICD-10-CM | POA: Diagnosis present

## 2019-03-28 DIAGNOSIS — Z981 Arthrodesis status: Secondary | ICD-10-CM

## 2019-03-28 DIAGNOSIS — M4802 Spinal stenosis, cervical region: Secondary | ICD-10-CM | POA: Diagnosis present

## 2019-03-28 DIAGNOSIS — R519 Headache, unspecified: Secondary | ICD-10-CM | POA: Diagnosis present

## 2019-03-28 DIAGNOSIS — Z419 Encounter for procedure for purposes other than remedying health state, unspecified: Secondary | ICD-10-CM

## 2019-03-28 DIAGNOSIS — M2578 Osteophyte, vertebrae: Secondary | ICD-10-CM | POA: Diagnosis present

## 2019-03-28 DIAGNOSIS — S12130A Unspecified traumatic displaced spondylolisthesis of second cervical vertebra, initial encounter for closed fracture: Secondary | ICD-10-CM

## 2019-03-28 HISTORY — DX: Prediabetes: R73.03

## 2019-03-28 LAB — CBC WITH DIFFERENTIAL/PLATELET
Abs Immature Granulocytes: 0.01 10*3/uL (ref 0.00–0.07)
Basophils Absolute: 0 10*3/uL (ref 0.0–0.1)
Basophils Relative: 0 %
Eosinophils Absolute: 0 10*3/uL (ref 0.0–0.5)
Eosinophils Relative: 0 %
HCT: 38.4 % (ref 36.0–46.0)
Hemoglobin: 12.6 g/dL (ref 12.0–15.0)
Immature Granulocytes: 0 %
Lymphocytes Relative: 25 %
Lymphs Abs: 1.3 10*3/uL (ref 0.7–4.0)
MCH: 29.1 pg (ref 26.0–34.0)
MCHC: 32.8 g/dL (ref 30.0–36.0)
MCV: 88.7 fL (ref 80.0–100.0)
Monocytes Absolute: 0.5 10*3/uL (ref 0.1–1.0)
Monocytes Relative: 10 %
Neutro Abs: 3.2 10*3/uL (ref 1.7–7.7)
Neutrophils Relative %: 65 %
Platelets: 163 10*3/uL (ref 150–400)
RBC: 4.33 MIL/uL (ref 3.87–5.11)
RDW: 13.1 % (ref 11.5–15.5)
WBC: 5.1 10*3/uL (ref 4.0–10.5)
nRBC: 0 % (ref 0.0–0.2)

## 2019-03-28 LAB — COMPREHENSIVE METABOLIC PANEL
ALT: 53 U/L — ABNORMAL HIGH (ref 0–44)
AST: 128 U/L — ABNORMAL HIGH (ref 15–41)
Albumin: 3.2 g/dL — ABNORMAL LOW (ref 3.5–5.0)
Alkaline Phosphatase: 41 U/L (ref 38–126)
Anion gap: 10 (ref 5–15)
BUN: 14 mg/dL (ref 8–23)
CO2: 26 mmol/L (ref 22–32)
Calcium: 8.6 mg/dL — ABNORMAL LOW (ref 8.9–10.3)
Chloride: 103 mmol/L (ref 98–111)
Creatinine, Ser: 1.01 mg/dL — ABNORMAL HIGH (ref 0.44–1.00)
GFR calc Af Amer: >60 mL/min (ref >60)
GFR calc non Af Amer: 60 mL/min — ABNORMAL LOW (ref >60)
Glucose, Bld: 98 mg/dL (ref 70–99)
Potassium: 3.5 mmol/L (ref 3.5–5.1)
Sodium: 139 mmol/L (ref 135–145)
Total Bilirubin: 0.7 mg/dL (ref 0.3–1.2)
Total Protein: 6.4 g/dL — ABNORMAL LOW (ref 6.5–8.1)

## 2019-03-28 LAB — RESPIRATORY PANEL BY RT PCR (FLU A&B, COVID)
Influenza A by PCR: NEGATIVE
Influenza B by PCR: NEGATIVE
SARS Coronavirus 2 by RT PCR: NEGATIVE

## 2019-03-28 LAB — TYPE AND SCREEN
ABO/RH(D): O POS
Antibody Screen: NEGATIVE
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
## 2019-03-28 MED ORDER — DOCUSATE SODIUM 100 MG PO CAPS: 100.0000 mg | ORAL_CAPSULE | Freq: Two times a day (BID) | ORAL | Status: DC | PRN

## 2019-03-28 MED ORDER — AMLODIPINE BESYLATE 5 MG PO TABS
5.0000 mg | ORAL_TABLET | Freq: Every day | ORAL | Status: DC
  Administered 2019-03-30 – 2019-04-02 (×4): 5 mg via ORAL
  Filled 2019-03-28 (×4): qty 1

## 2019-03-28 MED ORDER — SENNA 8.6 MG PO TABS
1.0000 | ORAL_TABLET | Freq: Two times a day (BID) | ORAL | Status: DC
  Administered 2019-03-29 – 2019-04-02 (×8): 8.6 mg via ORAL
  Filled 2019-03-28 (×8): qty 1

## 2019-03-28 MED ORDER — IOHEXOL 350 MG/ML SOLN
75.0000 mL | Freq: Once | INTRAVENOUS | Status: AC | PRN
  Administered 2019-03-28: 75 mL via INTRAVENOUS

## 2019-03-28 MED ORDER — HYDROCODONE-ACETAMINOPHEN 7.5-325 MG PO TABS
1.0000 | ORAL_TABLET | Freq: Four times a day (QID) | ORAL | Status: DC | PRN
  Administered 2019-03-30 (×2): 1 via ORAL
  Filled 2019-03-28 (×2): qty 1

## 2019-03-28 MED ORDER — SODIUM CHLORIDE 0.9% FLUSH
3.0000 mL | Freq: Two times a day (BID) | INTRAVENOUS | Status: DC
  Administered 2019-03-28 – 2019-04-02 (×6): 3 mL via INTRAVENOUS

## 2019-03-28 MED ORDER — ONDANSETRON HCL 4 MG/2ML IJ SOLN
4.0000 mg | Freq: Four times a day (QID) | INTRAMUSCULAR | Status: DC | PRN
  Administered 2019-03-29 – 2019-03-31 (×2): 4 mg via INTRAVENOUS
  Filled 2019-03-28 (×2): qty 2

## 2019-03-28 MED ORDER — MENTHOL 3 MG MT LOZG
1.0000 | LOZENGE | OROMUCOSAL | Status: DC | PRN
  Administered 2019-03-30: 3 mg via ORAL
  Filled 2019-03-28 (×2): qty 9

## 2019-03-28 MED ORDER — METHOCARBAMOL 500 MG PO TABS
500.0000 mg | ORAL_TABLET | Freq: Four times a day (QID) | ORAL | Status: DC | PRN
  Administered 2019-03-30: 500 mg via ORAL
  Filled 2019-03-28: qty 1

## 2019-03-28 MED ORDER — ONDANSETRON HCL 4 MG/2ML IJ SOLN
4.0000 mg | Freq: Once | INTRAMUSCULAR | Status: AC
  Administered 2019-03-28: 4 mg via INTRAVENOUS
  Filled 2019-03-28: qty 2

## 2019-03-28 MED ORDER — ACETAMINOPHEN 325 MG PO TABS
650.0000 mg | ORAL_TABLET | ORAL | Status: DC | PRN
  Administered 2019-03-30: 650 mg via ORAL
  Filled 2019-03-28: qty 2

## 2019-03-28 MED ORDER — IOHEXOL 300 MG/ML  SOLN
75.0000 mL | Freq: Once | INTRAMUSCULAR | Status: AC | PRN
  Administered 2019-03-28: 75 mL via INTRAVENOUS

## 2019-03-28 MED ORDER — LISINOPRIL-HYDROCHLOROTHIAZIDE 20-25 MG PO TABS: 1.0000 | ORAL_TABLET | Freq: Every day | ORAL | Status: DC

## 2019-03-28 MED ORDER — ACETAMINOPHEN 500 MG PO TABS
1000.0000 mg | ORAL_TABLET | Freq: Four times a day (QID) | ORAL | Status: AC
  Administered 2019-03-29 (×2): 1000 mg via ORAL
  Filled 2019-03-28 (×2): qty 2

## 2019-03-28 MED ORDER — POTASSIUM CHLORIDE IN NACL 20-0.9 MEQ/L-% IV SOLN
INTRAVENOUS | Status: DC
  Filled 2019-03-28 (×14): qty 1000

## 2019-03-28 MED ORDER — LISINOPRIL 20 MG PO TABS
20.0000 mg | ORAL_TABLET | Freq: Every day | ORAL | Status: DC
  Administered 2019-03-30 – 2019-04-02 (×4): 20 mg via ORAL
  Filled 2019-03-28 (×4): qty 1

## 2019-03-28 MED ORDER — METHOCARBAMOL 1000 MG/10ML IJ SOLN
500.0000 mg | Freq: Four times a day (QID) | INTRAVENOUS | Status: DC | PRN
  Filled 2019-03-28 (×2): qty 5

## 2019-03-28 MED ORDER — MORPHINE SULFATE (PF) 2 MG/ML IV SOLN
2.0000 mg | INTRAVENOUS | Status: DC | PRN
  Administered 2019-03-29 – 2019-04-02 (×7): 2 mg via INTRAVENOUS
  Filled 2019-03-28 (×7): qty 1

## 2019-03-28 MED ORDER — POTASSIUM CHLORIDE 20 MEQ PO PACK
40.0000 meq | PACK | Freq: Once | ORAL | Status: AC
  Administered 2019-03-28: 40 meq via ORAL
  Filled 2019-03-28: qty 2

## 2019-03-28 MED ORDER — OXYCODONE-ACETAMINOPHEN 5-325 MG PO TABS
2.0000 | ORAL_TABLET | Freq: Once | ORAL | Status: AC
  Administered 2019-03-28: 2 via ORAL
  Filled 2019-03-28: qty 2

## 2019-03-28 MED ORDER — ROSUVASTATIN CALCIUM 20 MG PO TABS
40.0000 mg | ORAL_TABLET | Freq: Every day | ORAL | Status: DC
  Administered 2019-03-30 – 2019-04-02 (×4): 40 mg via ORAL
  Filled 2019-03-28: qty 4
  Filled 2019-03-28 (×3): qty 2
  Filled 2019-03-28 (×2): qty 4
  Filled 2019-03-28 (×2): qty 2
  Filled 2019-03-28: qty 4

## 2019-03-28 MED ORDER — FENTANYL CITRATE (PF) 100 MCG/2ML IJ SOLN
50.0000 ug | Freq: Once | INTRAMUSCULAR | Status: AC
  Administered 2019-03-28: 50 ug via INTRAVENOUS
  Filled 2019-03-28: qty 2

## 2019-03-28 MED ORDER — SODIUM CHLORIDE 0.9% FLUSH: 3.0000 mL | INTRAVENOUS | Status: DC | PRN

## 2019-03-28 MED ORDER — ACETAMINOPHEN 650 MG RE SUPP: 650.0000 mg | RECTAL | Status: DC | PRN

## 2019-03-28 MED ORDER — SODIUM CHLORIDE 0.9 % IV SOLN
250.0000 mL | INTRAVENOUS | Status: DC
  Administered 2019-03-29: 100 mL via INTRAVENOUS
  Administered 2019-03-29: 1000 mL via INTRAVENOUS

## 2019-03-28 MED ORDER — HYDROMORPHONE HCL 1 MG/ML IJ SOLN
0.5000 mg | Freq: Once | INTRAMUSCULAR | Status: AC
  Administered 2019-03-28: 0.5 mg via INTRAVENOUS
  Filled 2019-03-28: qty 1

## 2019-03-28 MED ORDER — DIAZEPAM 5 MG PO TABS
5.0000 mg | ORAL_TABLET | Freq: Once | ORAL | Status: AC
  Administered 2019-03-28: 5 mg via ORAL
  Filled 2019-03-28: qty 1

## 2019-03-28 MED ORDER — CELECOXIB 200 MG PO CAPS
200.0000 mg | ORAL_CAPSULE | Freq: Two times a day (BID) | ORAL | Status: DC
  Administered 2019-03-29: 200 mg via ORAL
  Filled 2019-03-28: qty 1

## 2019-03-28 MED ORDER — SODIUM CHLORIDE 0.9 % IV SOLN
Freq: Once | INTRAVENOUS | Status: AC
Start: 2019-03-28 — End: 2019-03-28

## 2019-03-28 MED ORDER — HYDROCHLOROTHIAZIDE 25 MG PO TABS
25.0000 mg | ORAL_TABLET | Freq: Every day | ORAL | Status: DC
  Administered 2019-03-30 – 2019-04-02 (×4): 25 mg via ORAL
  Filled 2019-03-28 (×5): qty 1

## 2019-03-28 MED ORDER — SODIUM CHLORIDE 0.9 % IV BOLUS
1000.0000 mL | Freq: Once | INTRAVENOUS | Status: AC
  Administered 2019-03-28: 1000 mL via INTRAVENOUS

## 2019-03-28 MED ORDER — ONDANSETRON HCL 4 MG PO TABS
4.0000 mg | ORAL_TABLET | Freq: Four times a day (QID) | ORAL | Status: DC | PRN
  Filled 2019-03-28: qty 1

## 2019-03-28 MED ORDER — PHENOL 1.4 % MT LIQD
1.0000 | OROMUCOSAL | Status: DC | PRN
  Filled 2019-03-28: qty 177

## 2019-03-28 NOTE — ED Notes (Addendum)
Patient placed on 2L o2 via [place] due to sats at 88% while sleeping. Sats at 2L are 95-96% while sleeping. Dr. Colon Branch informed.

## 2019-03-28 NOTE — ED Provider Notes (Signed)
CT angiogram is negative for vascular injury.  MRI is still pending.  Neurosurgery disposition still pending.   Emily Filbert, MD 03/28/19 [phone]
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
## 2019-03-28 NOTE — ED Notes (Signed)
Pt gown and sheets changed. Pt cleaned of emesis
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
## 2019-03-28 NOTE — ED Notes (Signed)
Patient taken to imaging. 
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
## 2019-03-28 NOTE — ED Notes (Signed)
Per MD [facility] request, will review CT results before sending pt to floor.

## 2019-03-28 NOTE — ED Notes (Signed)
Patient taken to MRI

## 2019-03-28 NOTE — ED Notes (Signed)
Patient placed in hospital gown. C-collar in place.

## 2019-03-28 NOTE — ED Notes (Signed)
Patient used call light, states she was nauseous. Dr. Colon Branch aware. Patient was found by this Training and development officer in a semi-upright position, twisted to the side. Patient states she vomited on her home mask which was removed. C-collar was repositioned, c-spine precautions in place. Corky Crafts was changed. Patient given Zofran and a new blanket. Patient given new mask.

## 2019-03-28 NOTE — ED Notes (Signed)
Pt had small amount of bile colored emesis.

## 2019-03-28 NOTE — ED Notes (Addendum)
Patient is sleeping at this time, wakes easily.
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
## 2019-03-28 NOTE — ED Notes (Signed)
Patient gave permission to speak with her daughter who was waiting in the lobby. Update was given to daughter Gavin Potters.

## 2019-03-28 NOTE — ED Notes (Addendum)
Patient was logrolled to the the left side for c/o nausea. Patient had a small bile-colored emesis. C-collar in place. Patient's gown  Was  Changed due to being soiled with emesis. Dr. Colon Branch informed.

## 2019-03-28 NOTE — ED Notes (Signed)
Pt placed on 2L [place] due to O2 sats of 90% when sleeping. O2 sats then increased to 96%.
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
## 2019-03-28 NOTE — ED Notes (Signed)
Pt had small amount of bile colored emesis. MD [facility] aware.

## 2019-03-28 NOTE — ED Notes (Addendum)
Service called for Aspen cervical collar at [phone]

## 2019-03-28 NOTE — ED Notes (Signed)
This RN called floor. Knox Royalty RN who was in another pt's room and informed this RN she will call back in a few minutes.

## 2019-03-29 ENCOUNTER — Other Ambulatory Visit: Payer: Self-pay

## 2019-03-29 ENCOUNTER — Inpatient Hospital Stay: Payer: Federal, State, Local not specified - PPO

## 2019-03-29 ENCOUNTER — Inpatient Hospital Stay: Payer: Federal, State, Local not specified - PPO | Admitting: Registered Nurse

## 2019-03-29 ENCOUNTER — Encounter
Admission: EM | Disposition: A | Discharge status: Home / Self Care | Payer: Self-pay | Source: Home / Self Care | Attending: Neurosurgery

## 2019-03-29 ENCOUNTER — Encounter: Payer: Self-pay | Admitting: Neurosurgery

## 2019-03-29 HISTORY — PX: POSTERIOR CERVICAL FUSION/FORAMINOTOMY: SHX5038

## 2019-03-29 LAB — SURGICAL PCR SCREEN
MRSA, PCR: NEGATIVE
Staphylococcus aureus: NEGATIVE

## 2019-03-29 LAB — PROTIME-INR
INR: 1 (ref 0.8–1.2)
Prothrombin Time: 12.8 seconds (ref 11.4–15.2)

## 2019-03-29 LAB — GLUCOSE, CAPILLARY: Glucose-Capillary: 144 mg/dL — ABNORMAL HIGH (ref 70–99)

## 2019-03-29 LAB — APTT: aPTT: 31 seconds (ref 24–36)

## 2019-03-29 IMAGING — RF DG CERVICAL SPINE 2 OR 3 VIEWS
1 series · 10 of 10 positions shown · non-contrast
Comparison: None.

CLINICAL DATA: Elective surgery.

FLUOROSCOPY TIME:  17 seconds.
Images: 10
EXAM:
CERVICAL SPINE - 2-3 VIEW

[Series 1: unknown protocol · 0.20mm/px · 10 of 10 slices shown]
[im 1/10]
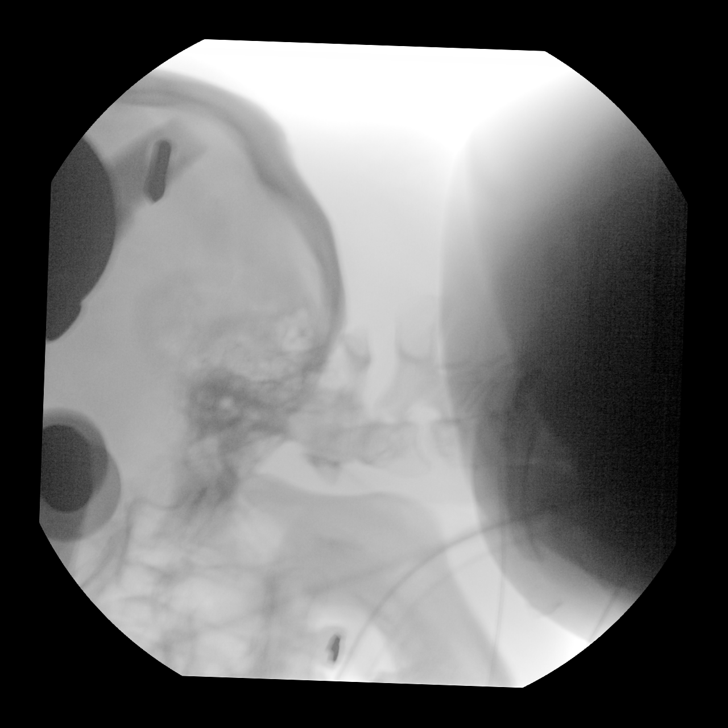
[im 2/10]
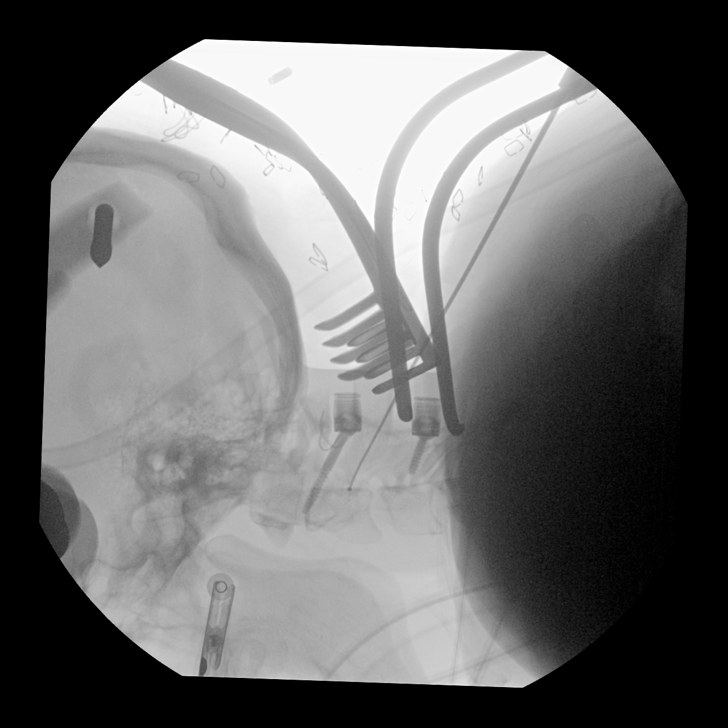
[im 3/10]
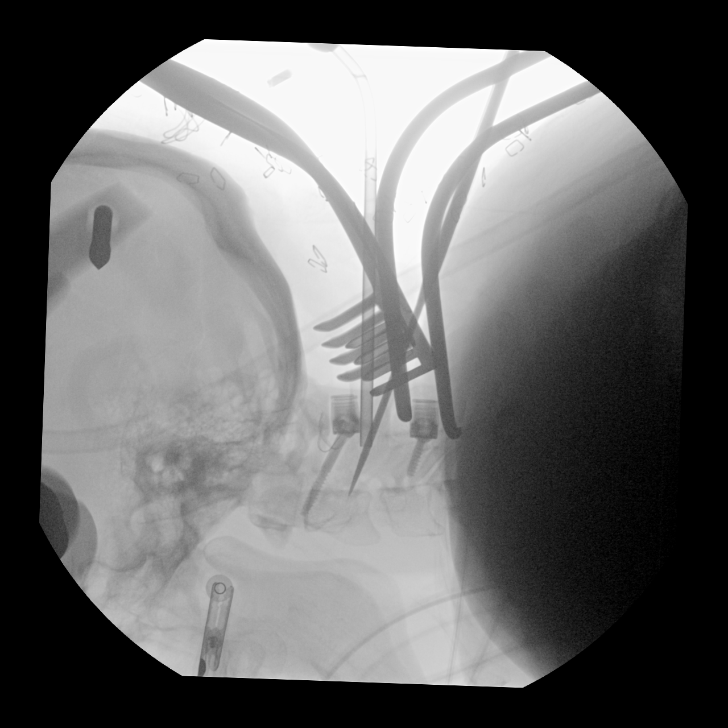
[im 4/10]
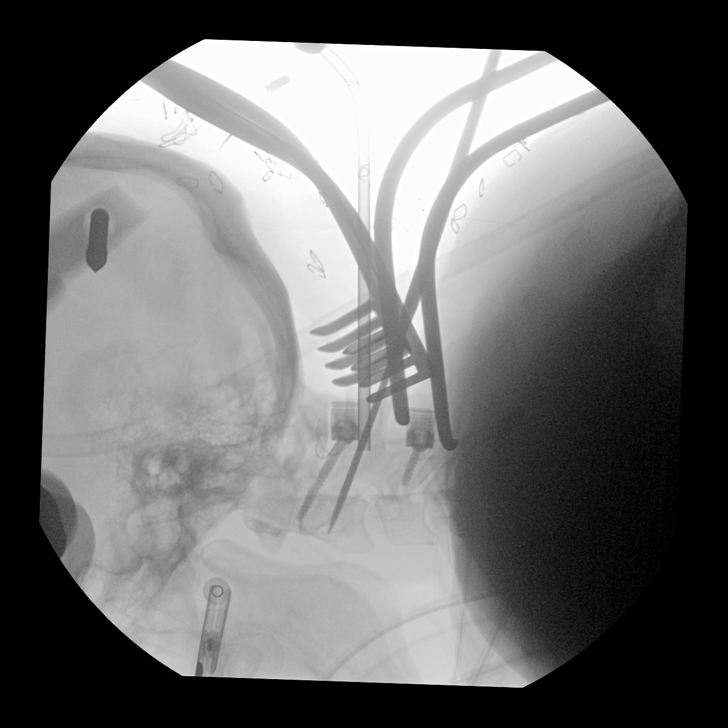
[im 5/10]
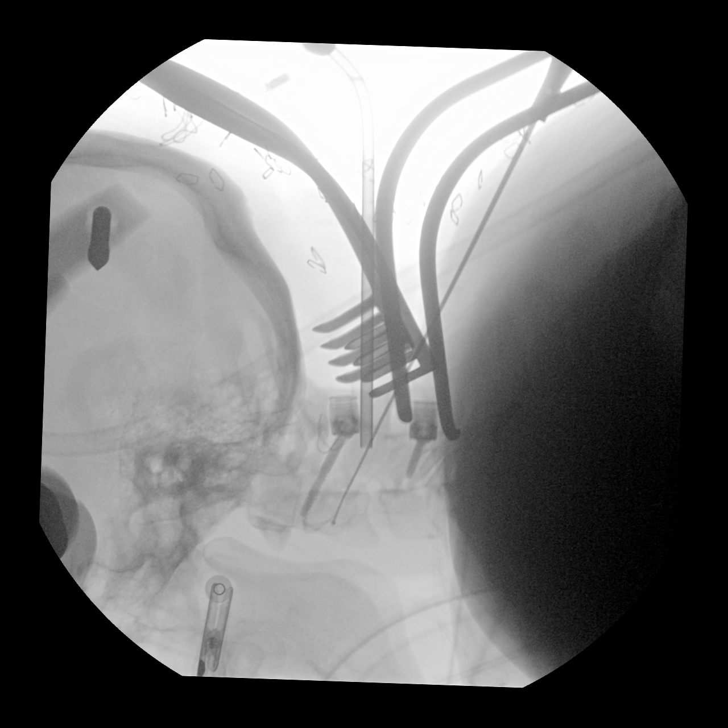
[im 6/10]
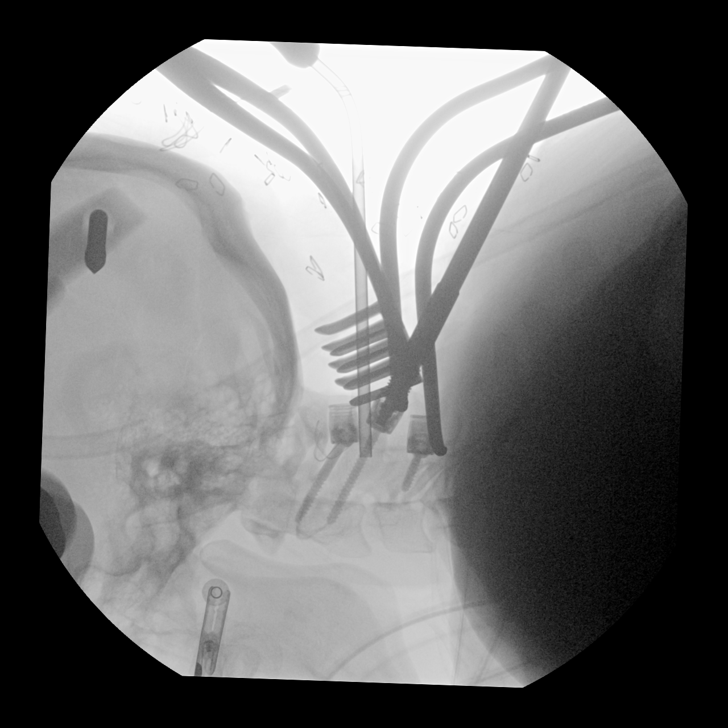
[im 7/10]
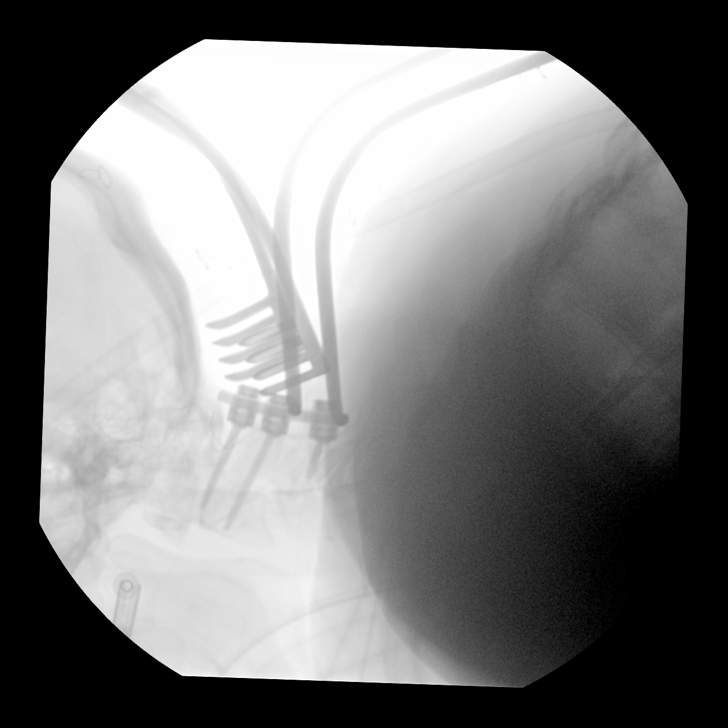
[im 8/10]
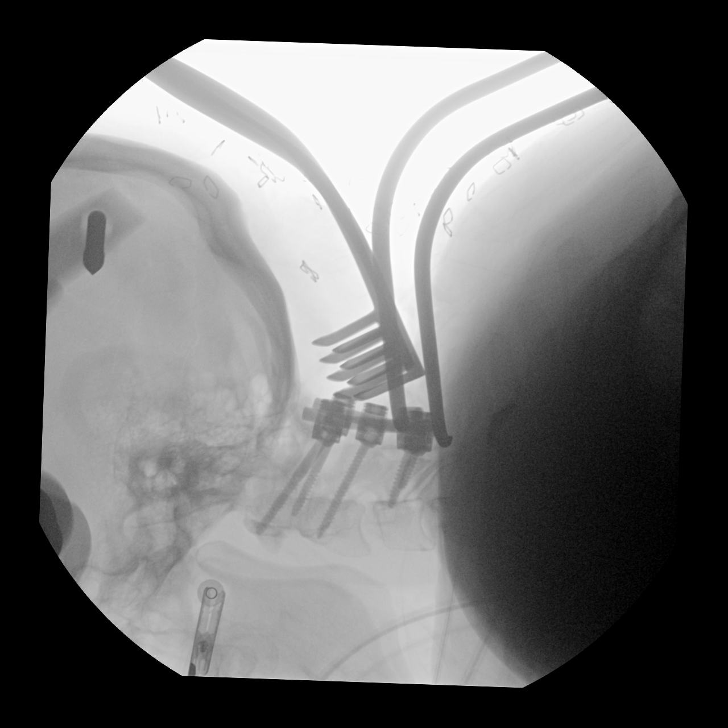
[im 9/10]
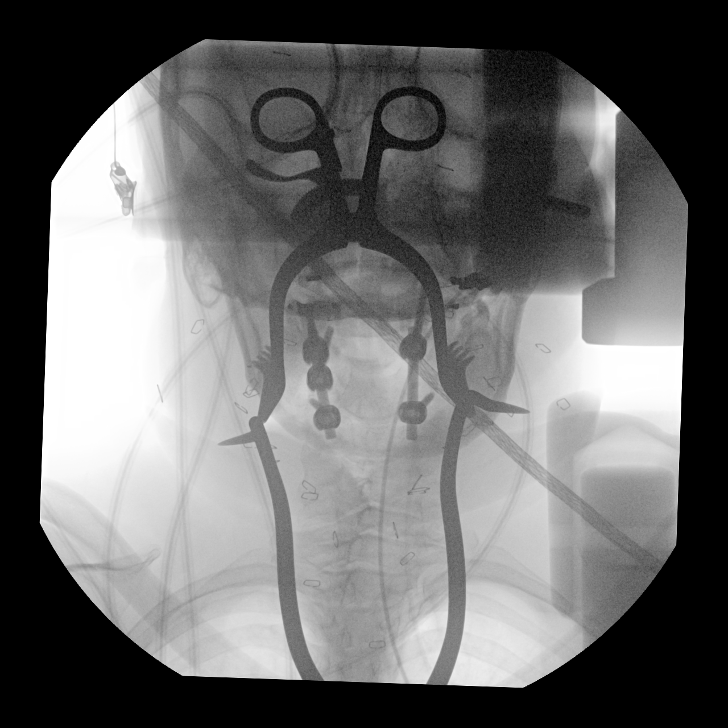
[im 10/10]
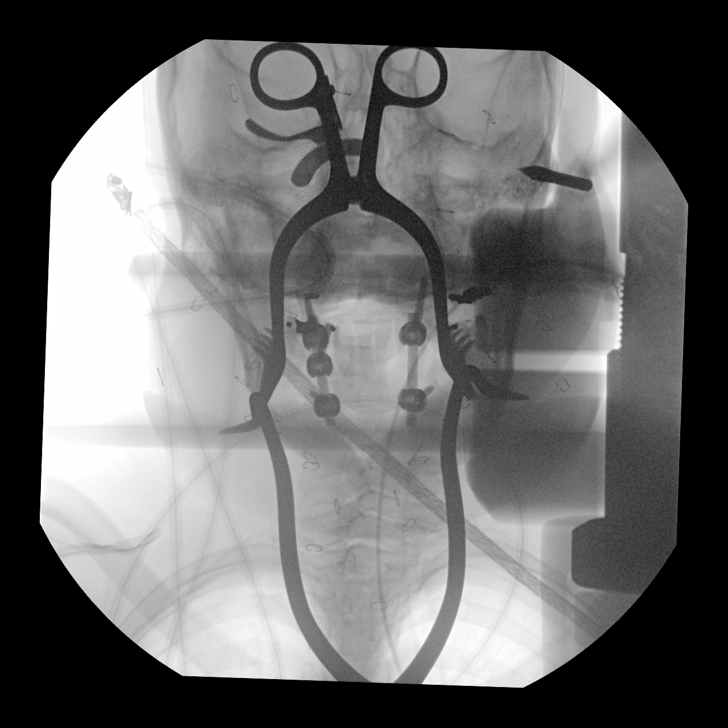

[10 of 10 positions shown; findings below may reference images not displayed]

FINDINGS: Multiple images were obtained throughout cervical spine surgery.
Pedicle rods and screws are placed as C1, C2, and C3.
IMPRESSION: Cervical spine hardware placement as above.

## 2019-03-29 IMAGING — RF DG C-ARM 1-60 MIN
1 series · 10 of 10 positions shown · non-contrast
Comparison: None.

CLINICAL DATA: Elective surgery.

FLUOROSCOPY TIME:  17 seconds.
Images: 10
EXAM:
CERVICAL SPINE - 2-3 VIEW

[Series 1: unknown protocol · 0.20mm/px · 10 of 10 slices shown]
[im 1/10]
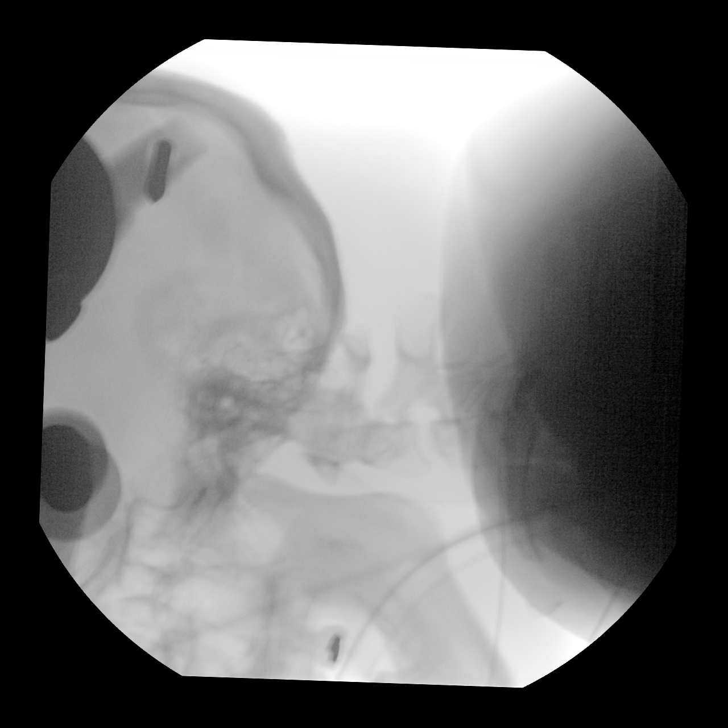
[im 2/10]
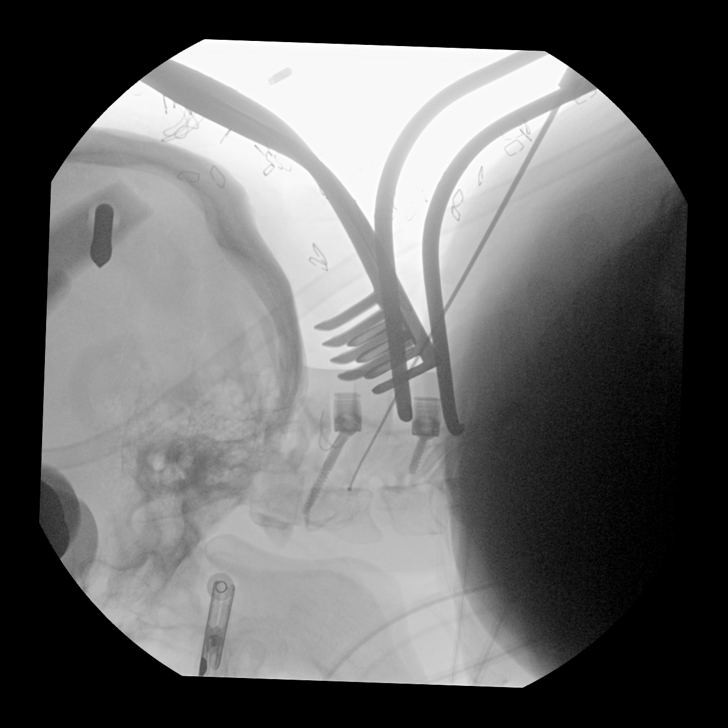
[im 3/10]
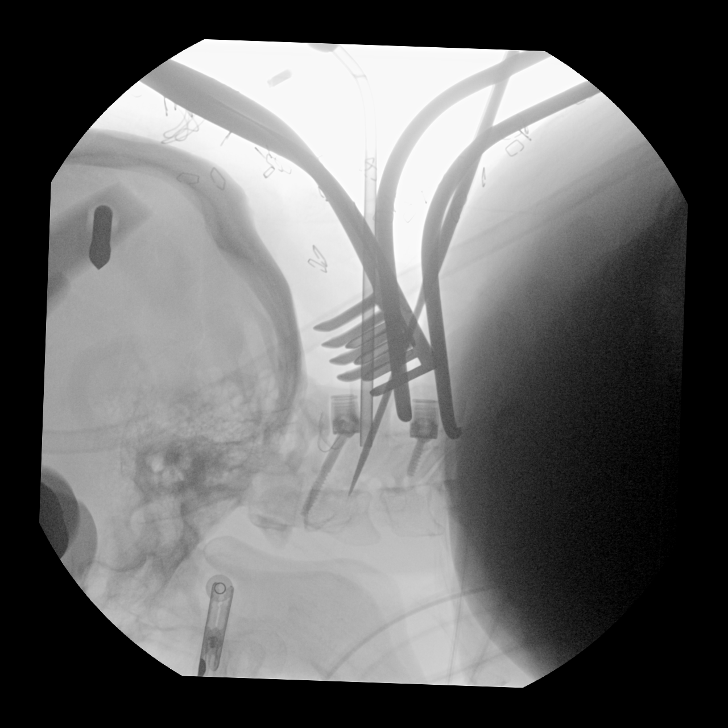
[im 4/10]
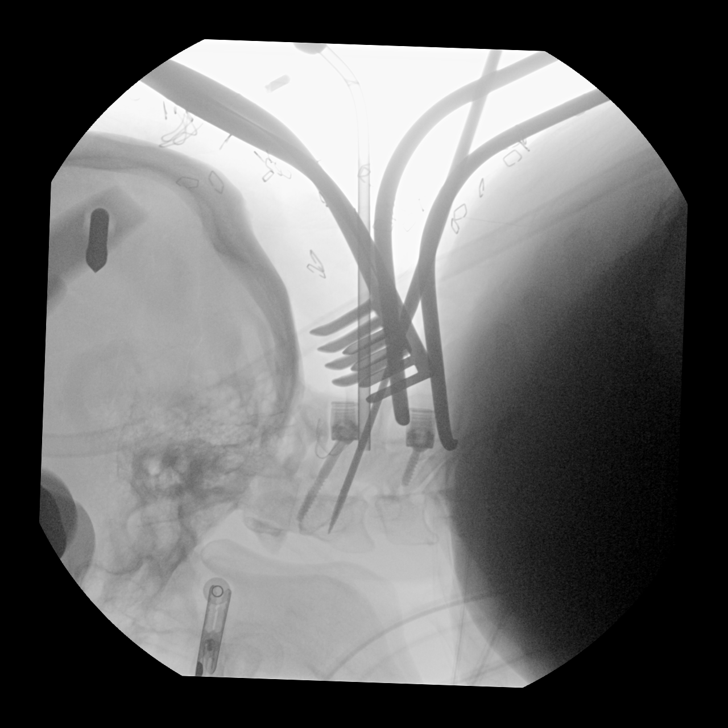
[im 5/10]
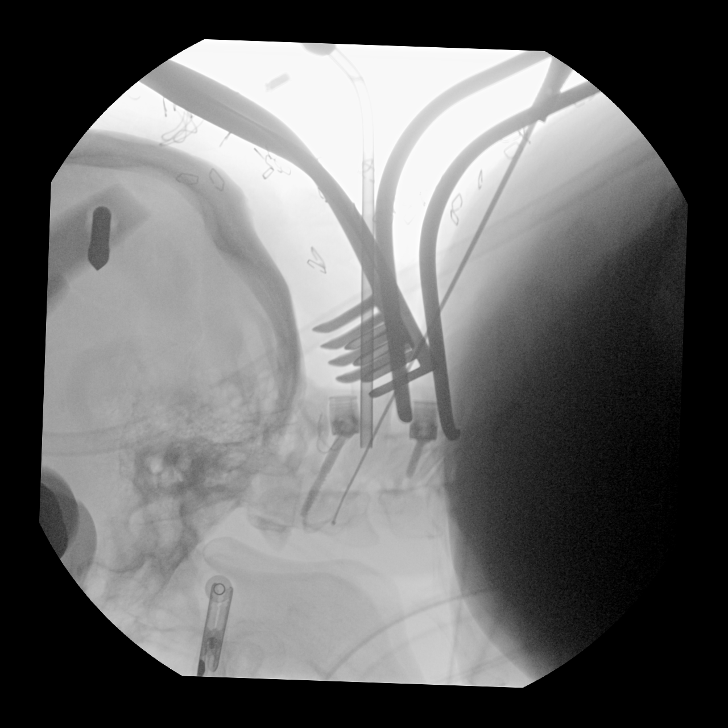
[im 6/10]
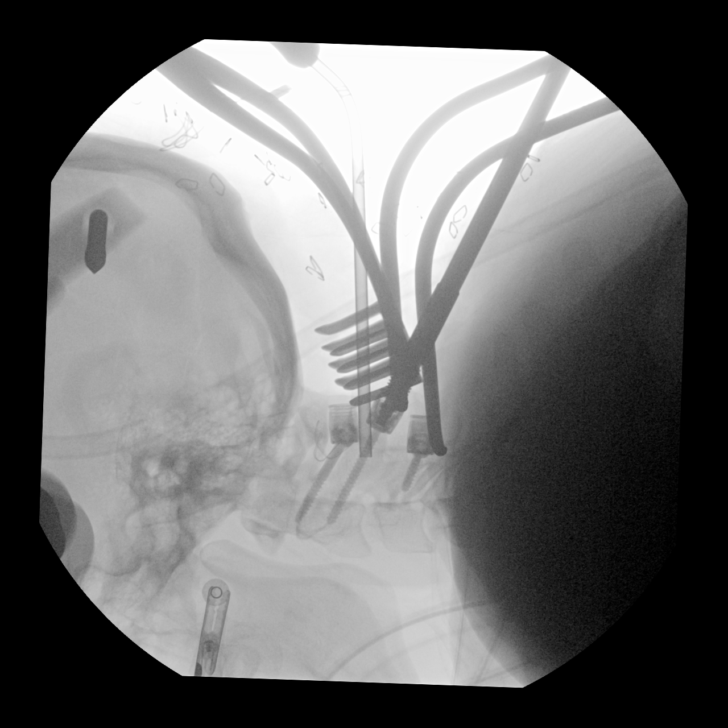
[im 7/10]
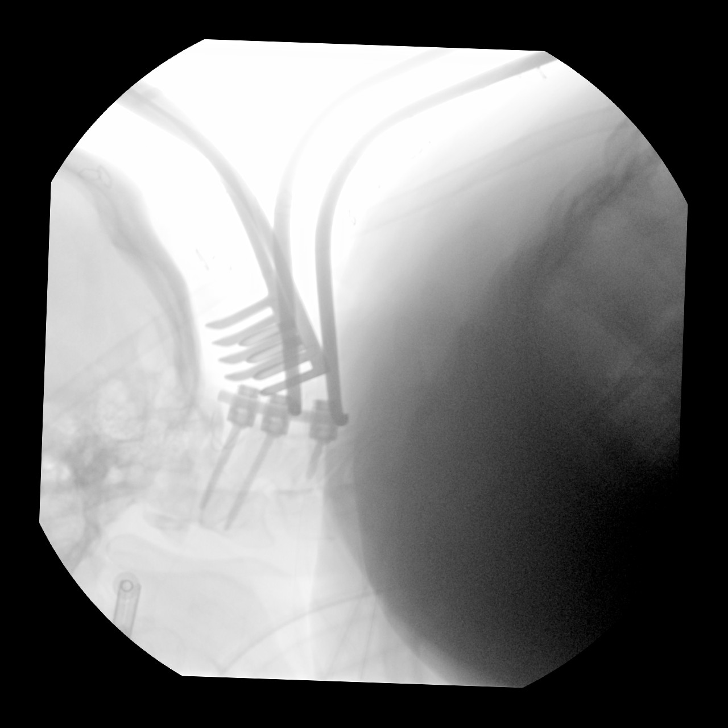
[im 8/10]
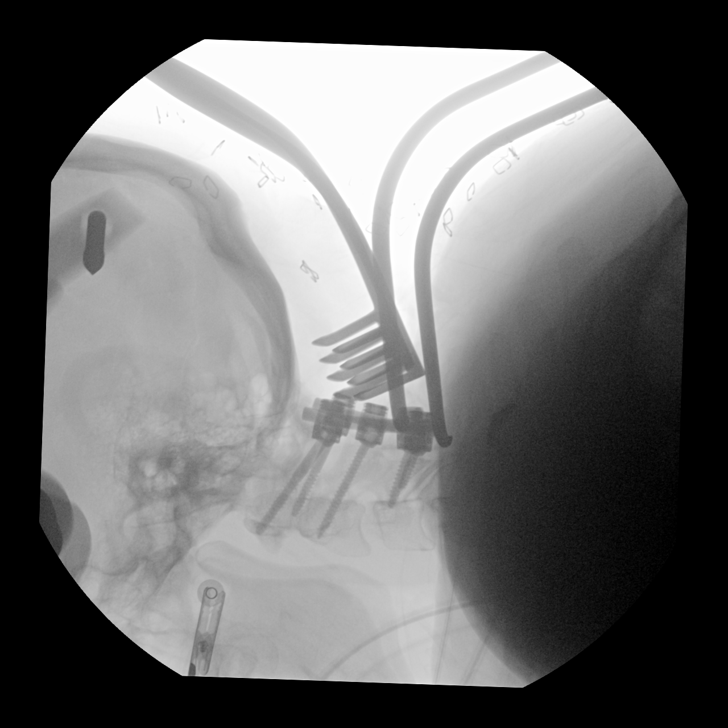
[im 9/10]
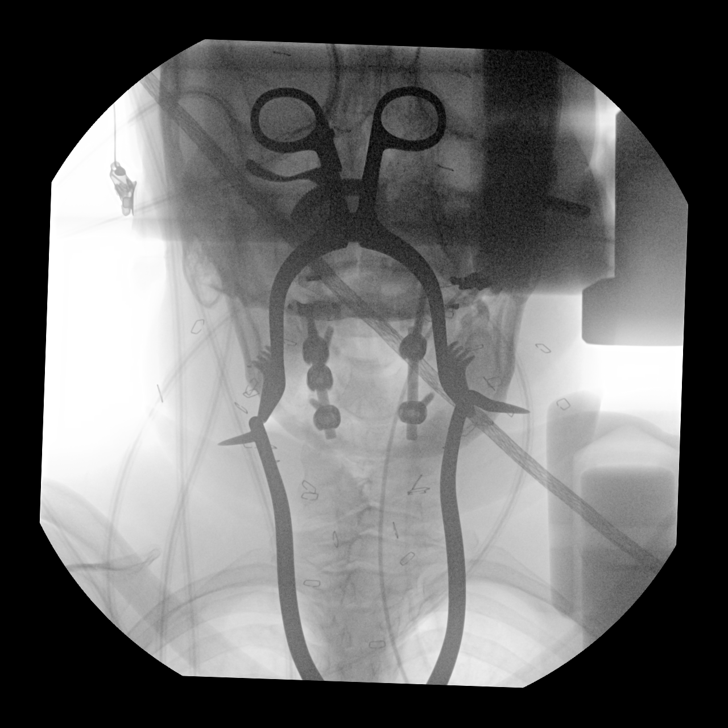
[im 10/10]
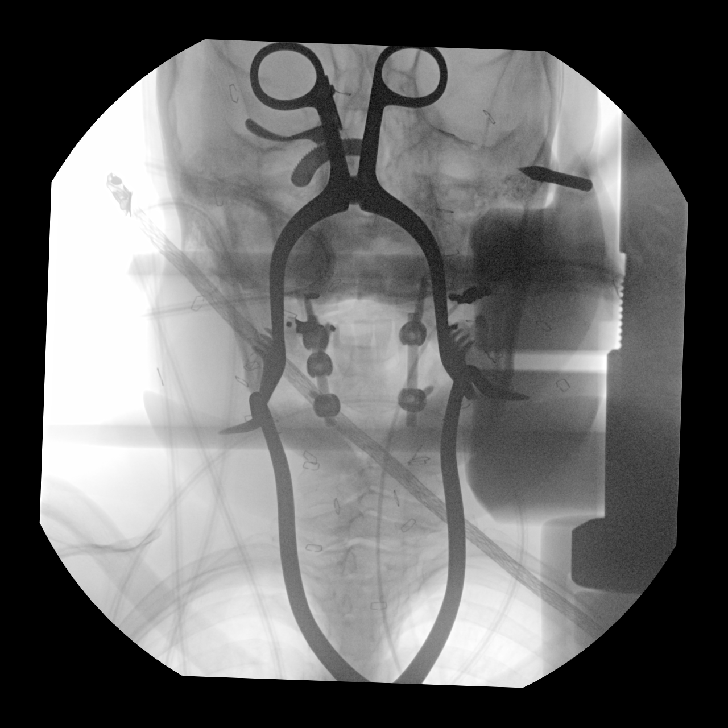

[10 of 10 positions shown; findings below may reference images not displayed]

FINDINGS: Multiple images were obtained throughout cervical spine surgery.
Pedicle rods and screws are placed as C1, C2, and C3.
IMPRESSION: Cervical spine hardware placement as above.

## 2019-03-29 IMAGING — RF DG C-ARM 1-60 MIN
1 series · 10 of 10 positions shown · non-contrast
Comparison: None.

CLINICAL DATA: Elective surgery.

FLUOROSCOPY TIME:  17 seconds.
Images: 10
EXAM:
CERVICAL SPINE - 2-3 VIEW

[Series 1: unknown protocol · 0.20mm/px · 10 of 10 slices shown]
[im 1/10]
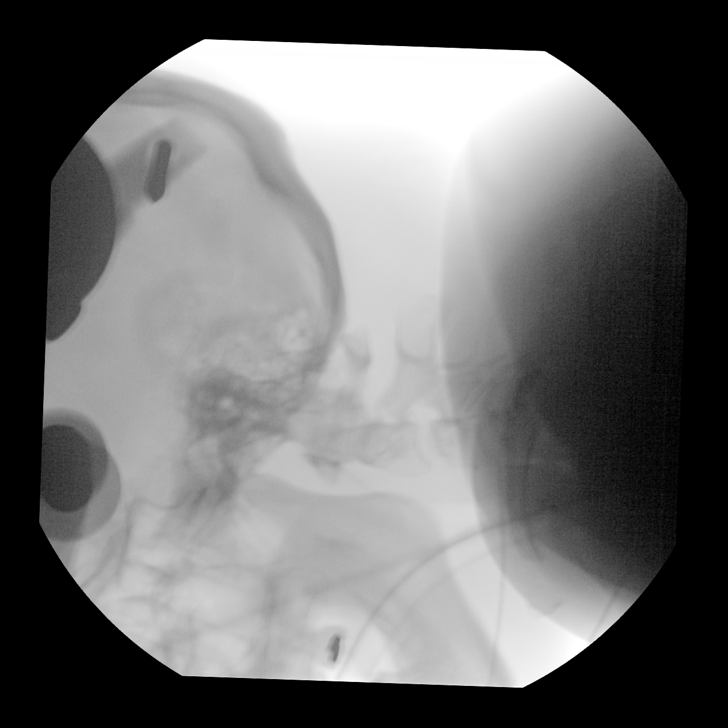
[im 2/10]
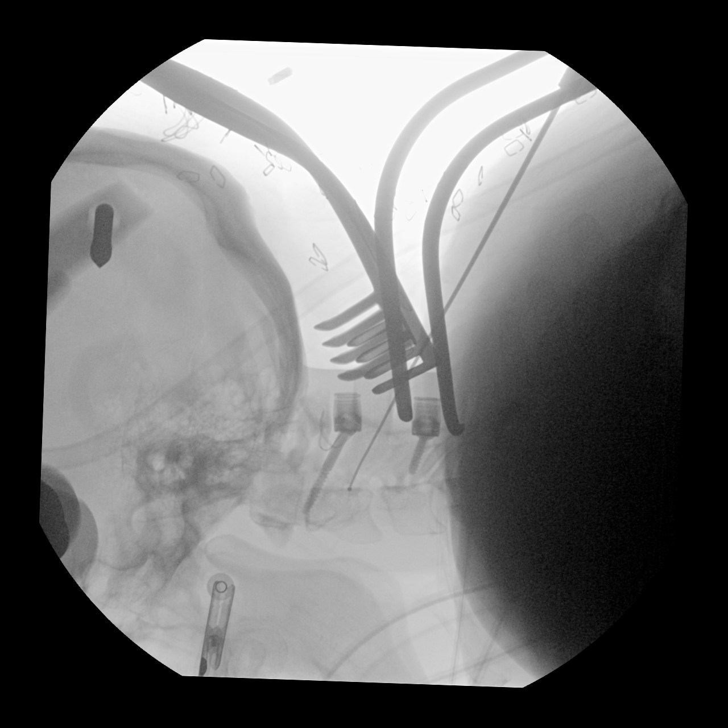
[im 3/10]
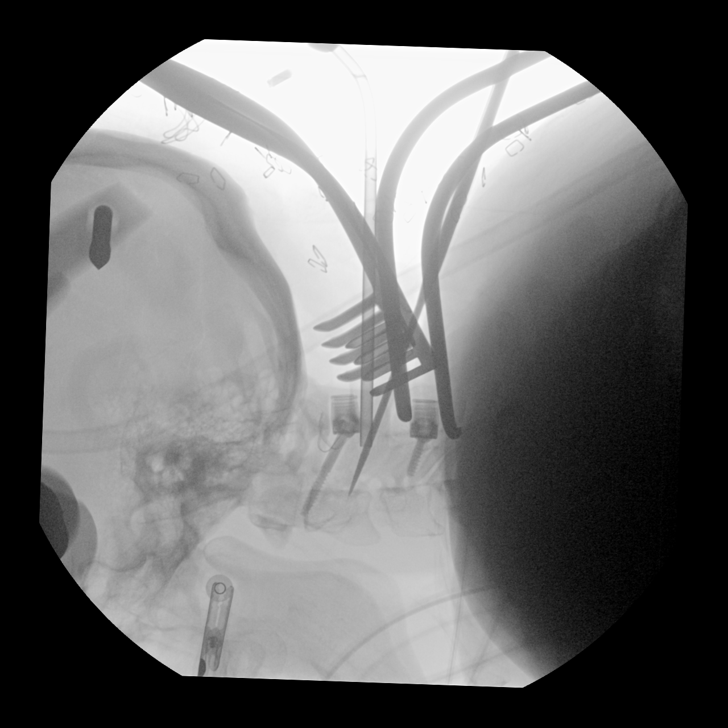
[im 4/10]
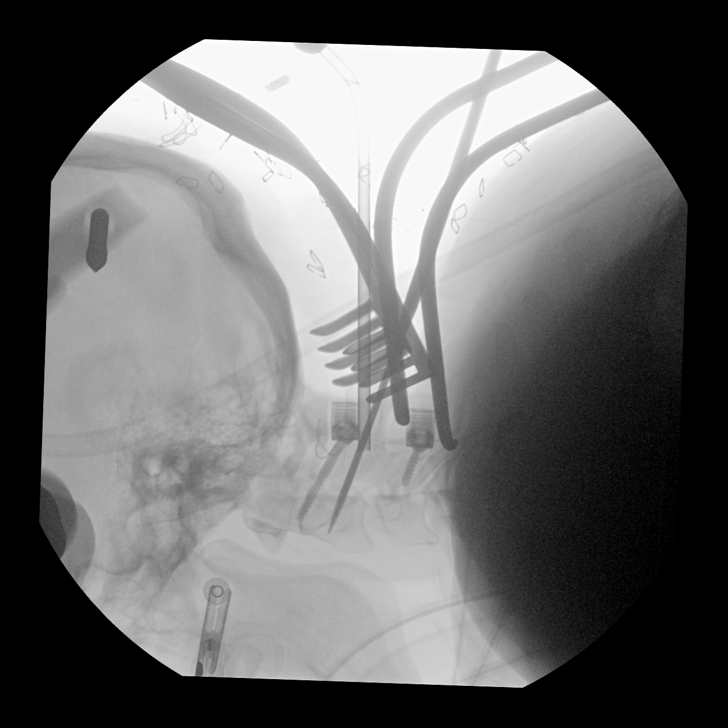
[im 5/10]
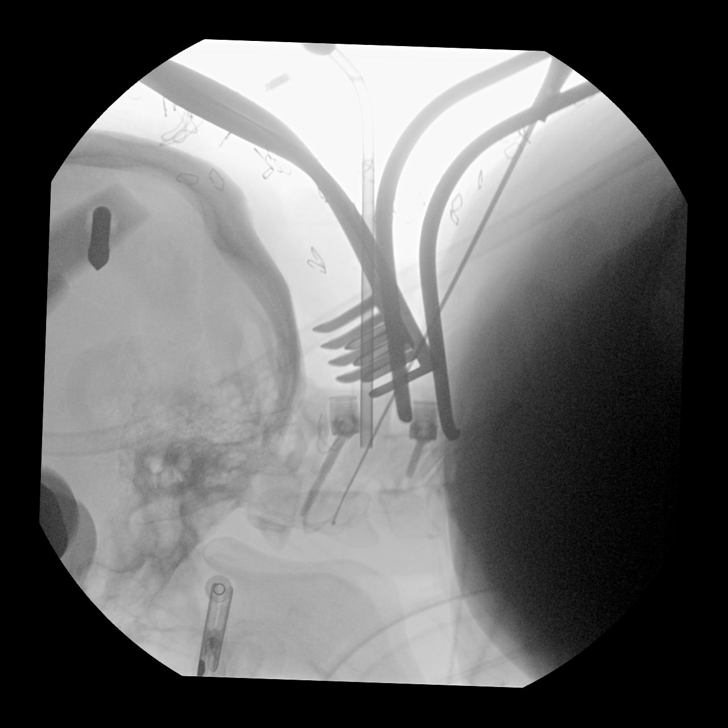
[im 6/10]
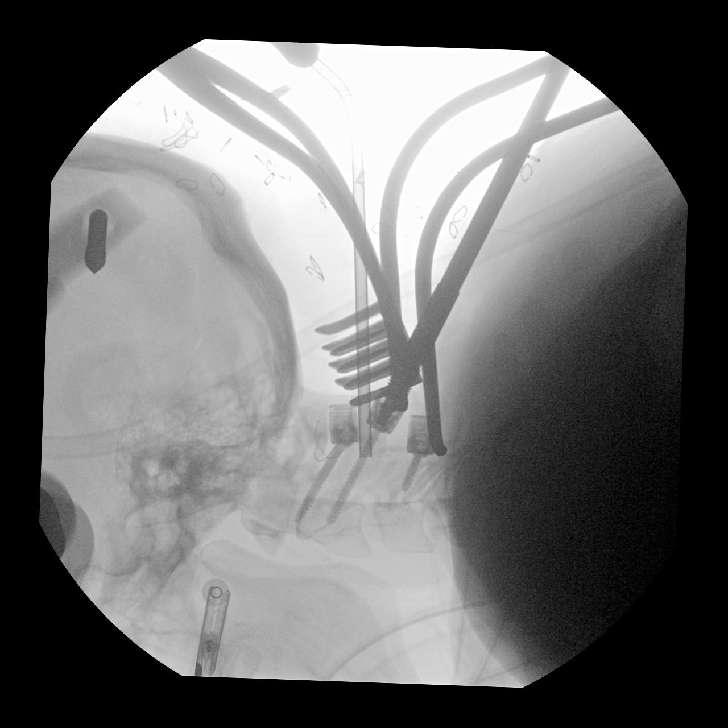
[im 7/10]
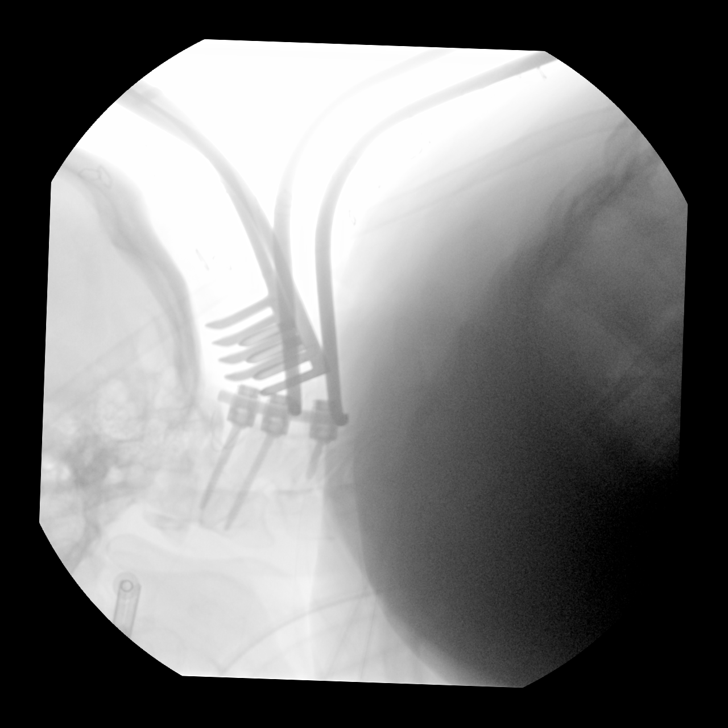
[im 8/10]
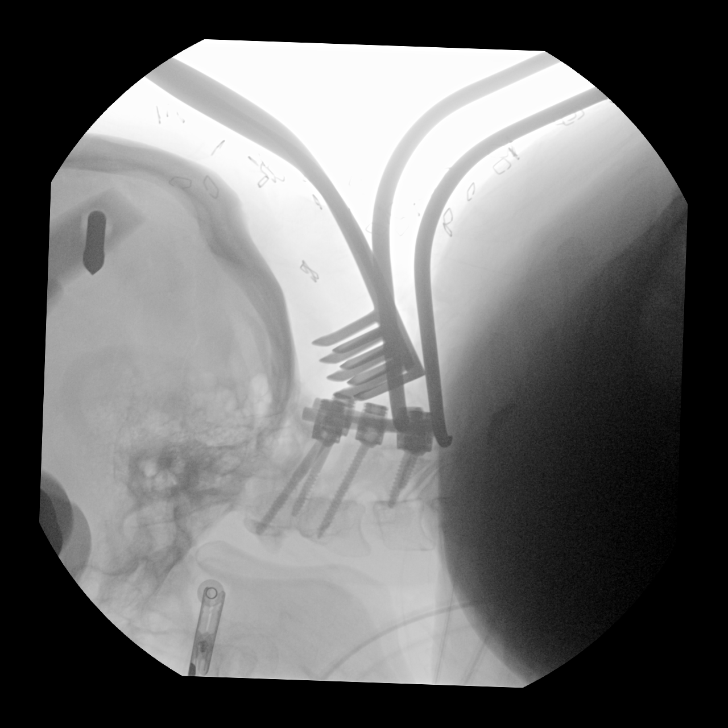
[im 9/10]
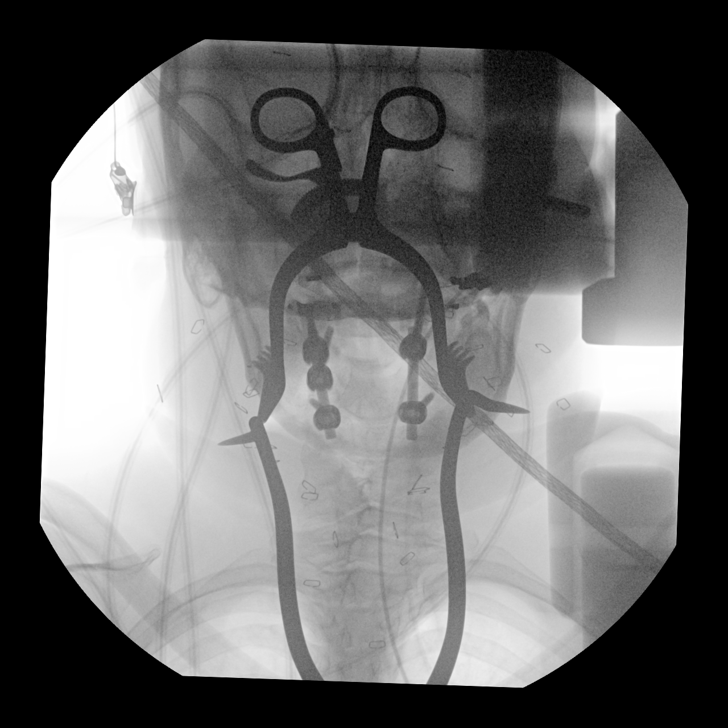
[im 10/10]
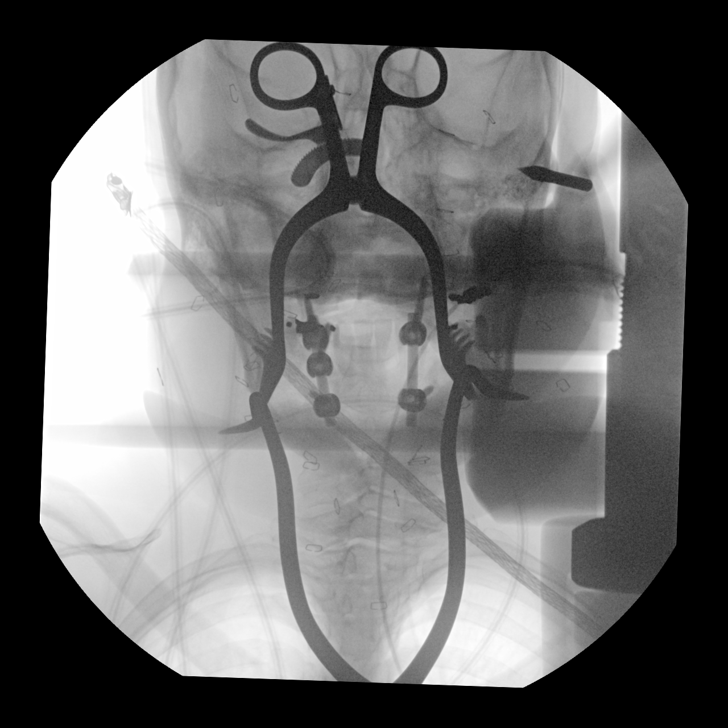

[10 of 10 positions shown; findings below may reference images not displayed]

FINDINGS: Multiple images were obtained throughout cervical spine surgery.
Pedicle rods and screws are placed as C1, C2, and C3.
IMPRESSION: Cervical spine hardware placement as above.

## 2019-03-29 IMAGING — RF DG C-ARM 1-60 MIN
1 series · 10 of 10 positions shown · non-contrast
Comparison: None.

CLINICAL DATA: Elective surgery.

FLUOROSCOPY TIME:  17 seconds.
Images: 10
EXAM:
CERVICAL SPINE - 2-3 VIEW

[Series 1: unknown protocol · 0.20mm/px · 10 of 10 slices shown]
[im 1/10]
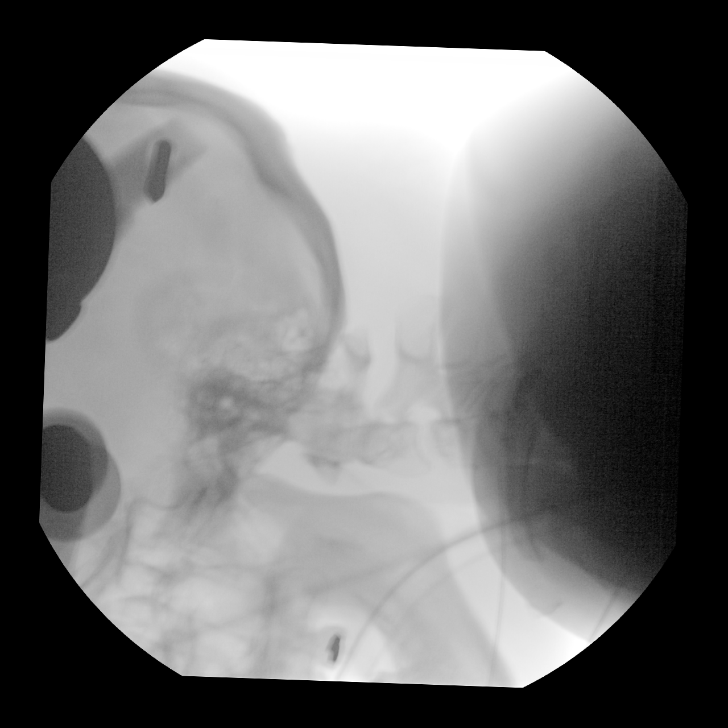
[im 2/10]
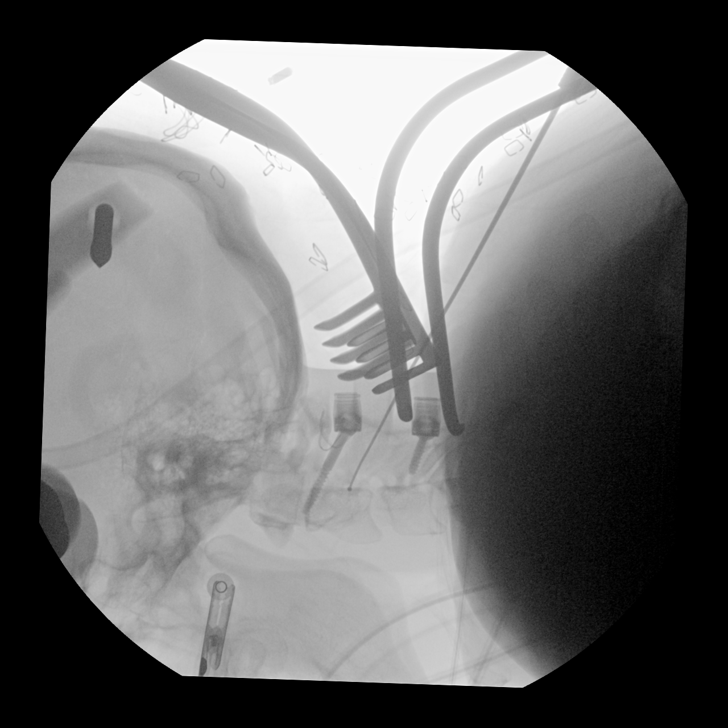
[im 3/10]
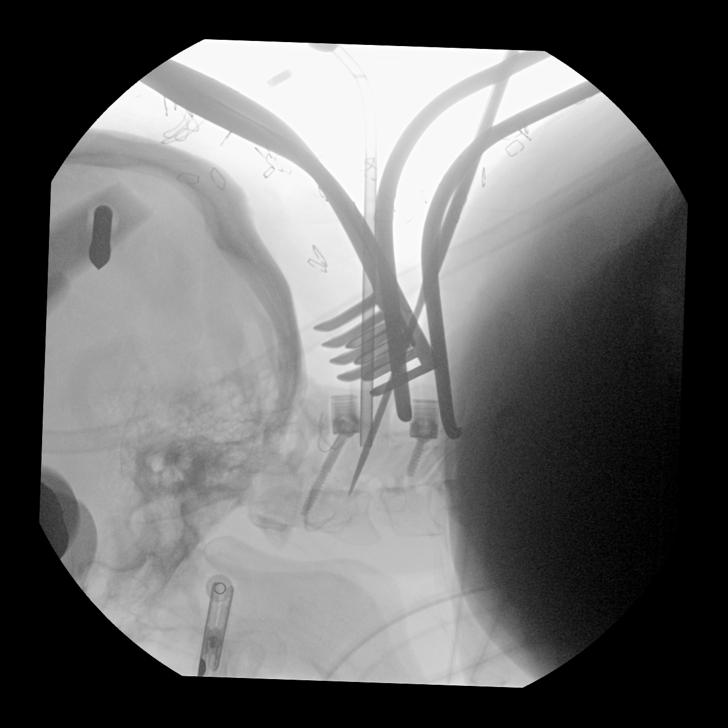
[im 4/10]
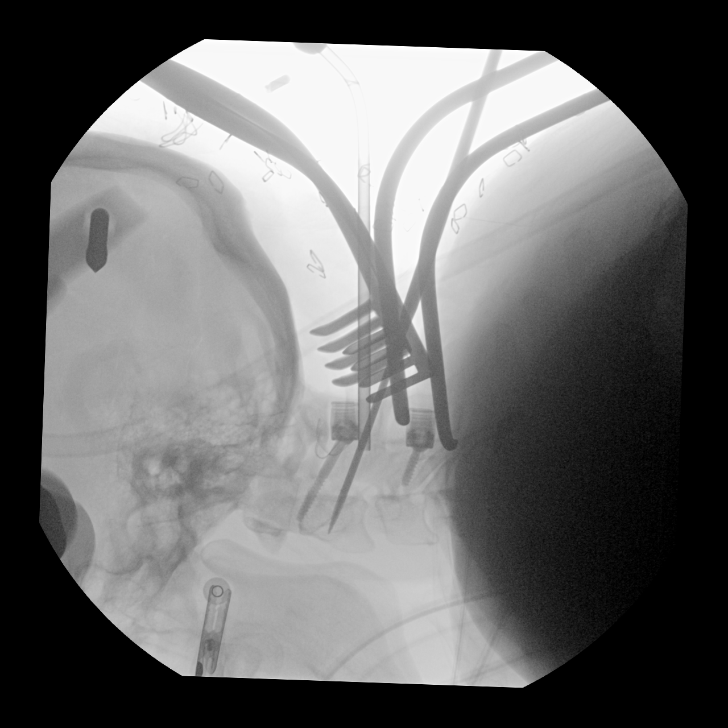
[im 5/10]
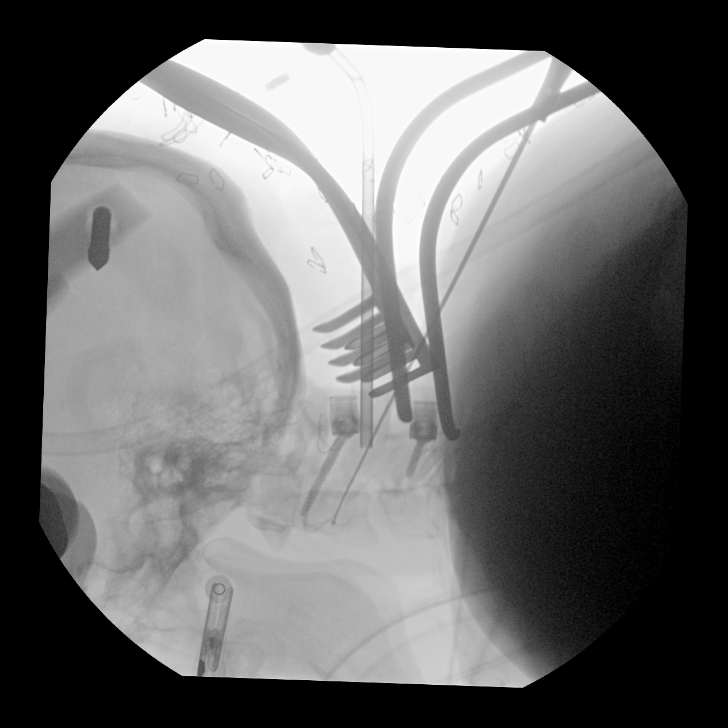
[im 6/10]
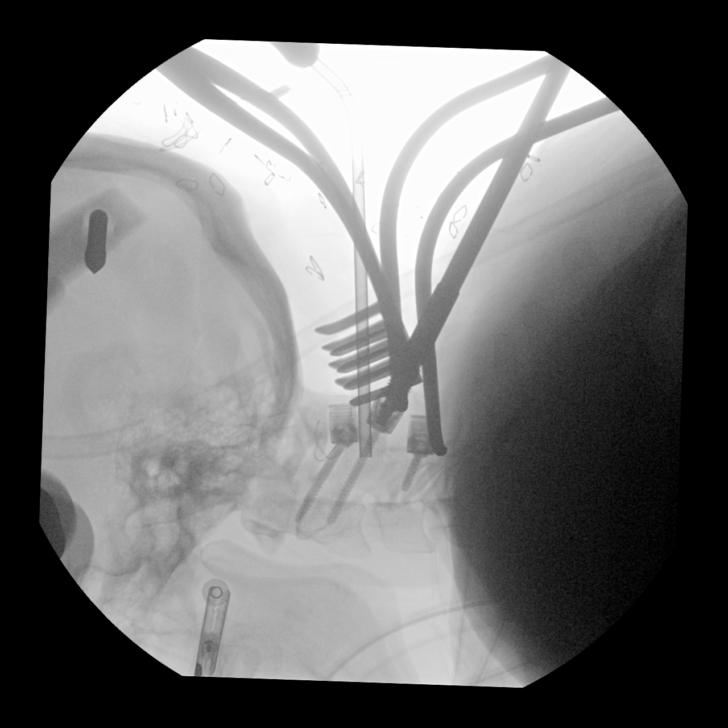
[im 7/10]
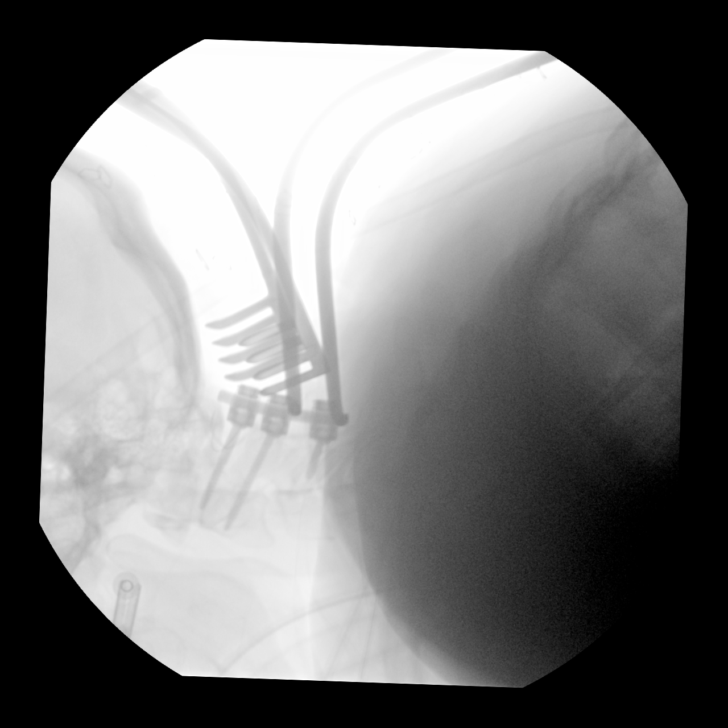
[im 8/10]
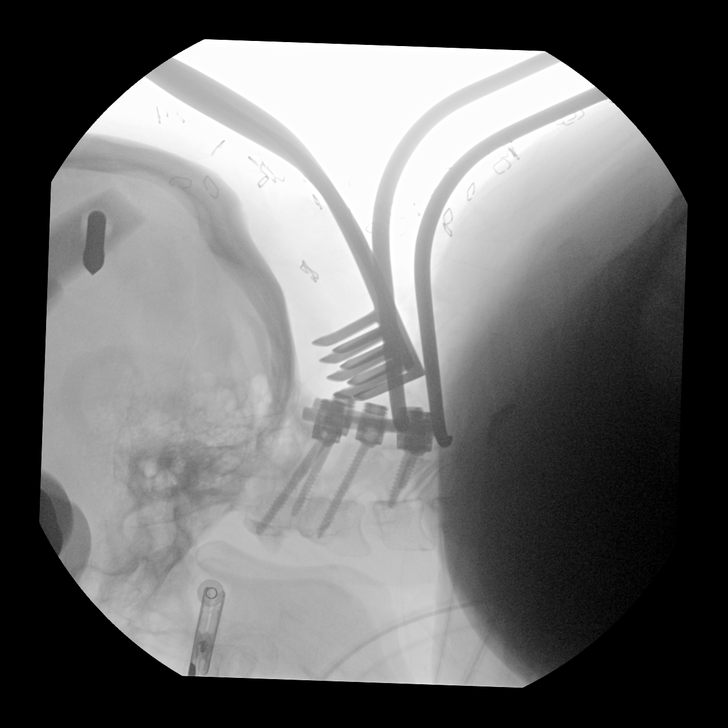
[im 9/10]
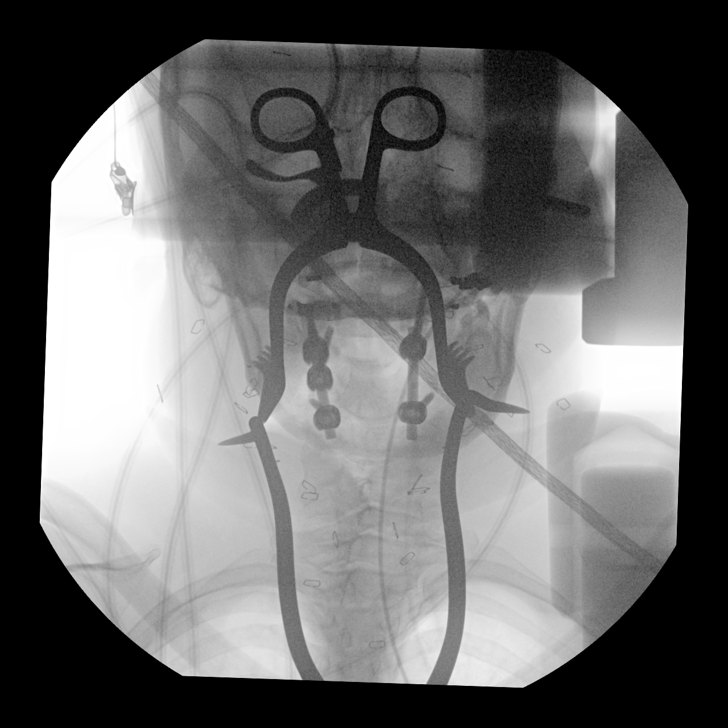
[im 10/10]
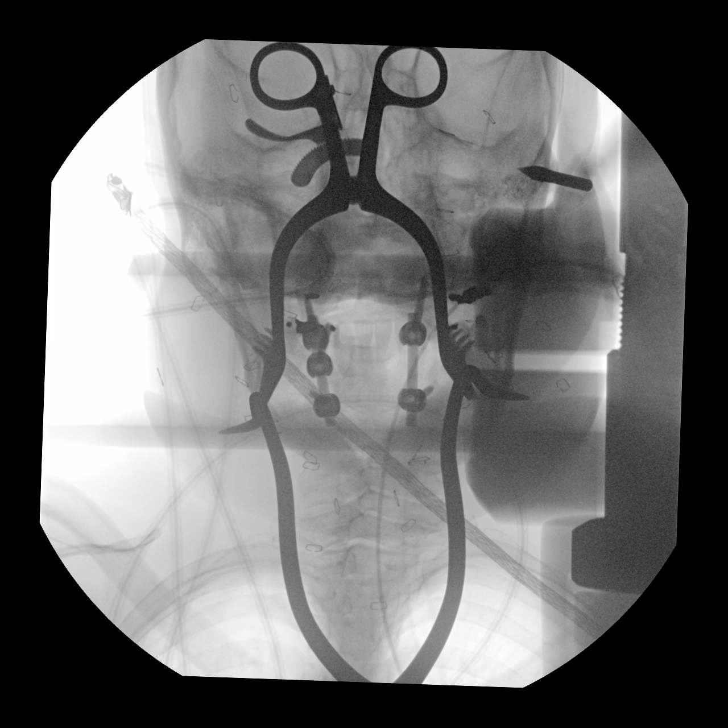

[10 of 10 positions shown; findings below may reference images not displayed]

FINDINGS: Multiple images were obtained throughout cervical spine surgery.
Pedicle rods and screws are placed as C1, C2, and C3.
IMPRESSION: Cervical spine hardware placement as above.

## 2019-03-29 SURGERY — POSTERIOR CERVICAL FUSION/FORAMINOTOMY LEVEL 2
Anesthesia: General

## 2019-03-29 MED ORDER — CEFAZOLIN SODIUM-DEXTROSE 2-4 GM/100ML-% IV SOLN
2.0000 g | Freq: Once | INTRAVENOUS | Status: AC
  Administered 2019-03-29: 2 g via INTRAVENOUS

## 2019-03-29 MED ORDER — ONDANSETRON HCL 4 MG/2ML IJ SOLN
INTRAMUSCULAR | Status: DC | PRN
  Administered 2019-03-29 (×2): 4 mg via INTRAVENOUS

## 2019-03-29 MED ORDER — MIDAZOLAM HCL 2 MG/2ML IJ SOLN
INTRAMUSCULAR | Status: AC
  Filled 2019-03-29: qty 2

## 2019-03-29 MED ORDER — PROMETHAZINE HCL 25 MG/ML IJ SOLN
INTRAMUSCULAR | Status: AC
  Administered 2019-03-29: 6.25 mg via INTRAVENOUS
  Filled 2019-03-29: qty 1

## 2019-03-29 MED ORDER — VASOPRESSIN 20 UNIT/ML IV SOLN
INTRAVENOUS | Status: AC
  Filled 2019-03-29: qty 1

## 2019-03-29 MED ORDER — FENTANYL CITRATE (PF) 100 MCG/2ML IJ SOLN
INTRAMUSCULAR | Status: AC
  Administered 2019-03-29: 25 ug via INTRAVENOUS
  Filled 2019-03-29: qty 2

## 2019-03-29 MED ORDER — DEXAMETHASONE SODIUM PHOSPHATE 10 MG/ML IJ SOLN
INTRAMUSCULAR | Status: DC | PRN
  Administered 2019-03-29: 10 mg via INTRAVENOUS

## 2019-03-29 MED ORDER — DEXMEDETOMIDINE HCL 200 MCG/2ML IV SOLN
INTRAVENOUS | Status: DC | PRN
  Administered 2019-03-29 (×3): 12 ug via INTRAVENOUS
  Administered 2019-03-29: 8 ug via INTRAVENOUS

## 2019-03-29 MED ORDER — FENTANYL CITRATE (PF) 250 MCG/5ML IJ SOLN
INTRAMUSCULAR | Status: AC
  Filled 2019-03-29: qty 5

## 2019-03-29 MED ORDER — GLYCOPYRROLATE 0.2 MG/ML IJ SOLN
INTRAMUSCULAR | Status: DC | PRN
  Administered 2019-03-29: .2 mg via INTRAVENOUS

## 2019-03-29 MED ORDER — SODIUM CHLORIDE 0.9 % IV SOLN
INTRAVENOUS | Status: DC | PRN
  Administered 2019-03-29: 10 ug/min via INTRAVENOUS

## 2019-03-29 MED ORDER — KETAMINE HCL 50 MG/ML IJ SOLN
INTRAMUSCULAR | Status: AC
  Filled 2019-03-29: qty 10

## 2019-03-29 MED ORDER — ALBUMIN HUMAN 5 % IV SOLN: INTRAVENOUS | Status: DC | PRN

## 2019-03-29 MED ORDER — GLYCOPYRROLATE 0.2 MG/ML IJ SOLN
INTRAMUSCULAR | Status: AC
  Filled 2019-03-29: qty 1

## 2019-03-29 MED ORDER — ROCURONIUM BROMIDE 50 MG/5ML IV SOLN
INTRAVENOUS | Status: AC
  Filled 2019-03-29: qty 1

## 2019-03-29 MED ORDER — ONDANSETRON HCL 4 MG/2ML IJ SOLN
INTRAMUSCULAR | Status: AC
  Filled 2019-03-29: qty 2

## 2019-03-29 MED ORDER — BACITRACIN 500 UNIT/GM EX OINT
TOPICAL_OINTMENT | CUTANEOUS | Status: DC | PRN
  Administered 2019-03-29: 1 via TOPICAL

## 2019-03-29 MED ORDER — "VISTASEAL 4 ML SINGLE DOSE KIT "
PACK | CUTANEOUS | Status: DC | PRN
  Administered 2019-03-29: 4 mL via TOPICAL

## 2019-03-29 MED ORDER — SUGAMMADEX SODIUM 500 MG/5ML IV SOLN
INTRAVENOUS | Status: DC | PRN
  Administered 2019-03-29: 500 mg via INTRAVENOUS

## 2019-03-29 MED ORDER — SODIUM CHLORIDE 0.9 % IR SOLN: Status: DC | PRN

## 2019-03-29 MED ORDER — THROMBIN 5000 UNITS EX SOLR
CUTANEOUS | Status: AC
  Filled 2019-03-29: qty 5000

## 2019-03-29 MED ORDER — SUCCINYLCHOLINE CHLORIDE 20 MG/ML IJ SOLN
INTRAMUSCULAR | Status: DC | PRN
  Administered 2019-03-29: 100 mg via INTRAVENOUS

## 2019-03-29 MED ORDER — THROMBIN 5000 UNITS EX SOLR
CUTANEOUS | Status: DC | PRN
  Administered 2019-03-29: 25000 [IU] via TOPICAL

## 2019-03-29 MED ORDER — FENTANYL CITRATE (PF) 100 MCG/2ML IJ SOLN
INTRAMUSCULAR | Status: AC
  Filled 2019-03-29: qty 2

## 2019-03-29 MED ORDER — PHENYLEPHRINE HCL (PRESSORS) 10 MG/ML IV SOLN
INTRAVENOUS | Status: DC | PRN
  Administered 2019-03-29: 100 ug via INTRAVENOUS
  Administered 2019-03-29: 200 ug via INTRAVENOUS
  Administered 2019-03-29: 150 ug via INTRAVENOUS
  Administered 2019-03-29: 100 ug via INTRAVENOUS
  Administered 2019-03-29: 200 ug via INTRAVENOUS
  Administered 2019-03-29: 100 ug via INTRAVENOUS
  Administered 2019-03-29: 250 ug via INTRAVENOUS
  Administered 2019-03-29: 200 ug via INTRAVENOUS
  Administered 2019-03-29 (×2): 100 ug via INTRAVENOUS
  Administered 2019-03-29: 200 ug via INTRAVENOUS
  Administered 2019-03-29 (×4): 100 ug via INTRAVENOUS
  Administered 2019-03-29: 50 ug via INTRAVENOUS

## 2019-03-29 MED ORDER — DEXAMETHASONE SODIUM PHOSPHATE 10 MG/ML IJ SOLN
INTRAMUSCULAR | Status: AC
  Filled 2019-03-29: qty 1

## 2019-03-29 MED ORDER — MIDAZOLAM HCL 2 MG/2ML IJ SOLN
INTRAMUSCULAR | Status: DC | PRN
  Administered 2019-03-29: 2 mg via INTRAVENOUS

## 2019-03-29 MED ORDER — FENTANYL CITRATE (PF) 100 MCG/2ML IJ SOLN
INTRAMUSCULAR | Status: DC | PRN
  Administered 2019-03-29 (×2): 25 ug via INTRAVENOUS
  Administered 2019-03-29 (×4): 50 ug via INTRAVENOUS
  Administered 2019-03-29: 100 ug via INTRAVENOUS

## 2019-03-29 MED ORDER — SODIUM CHLORIDE (PF) 0.9 % IJ SOLN
INTRAMUSCULAR | Status: AC
  Filled 2019-03-29: qty 10

## 2019-03-29 MED ORDER — ROCURONIUM BROMIDE 100 MG/10ML IV SOLN
INTRAVENOUS | Status: DC | PRN
  Administered 2019-03-29: 20 mg via INTRAVENOUS
  Administered 2019-03-29: 40 mg via INTRAVENOUS
  Administered 2019-03-29 (×2): 10 mg via INTRAVENOUS
  Administered 2019-03-29 (×2): 20 mg via INTRAVENOUS

## 2019-03-29 MED ORDER — ALBUMIN HUMAN 5 % IV SOLN
INTRAVENOUS | Status: AC
  Filled 2019-03-29: qty 500

## 2019-03-29 MED ORDER — ACETAMINOPHEN 10 MG/ML IV SOLN
INTRAVENOUS | Status: AC
  Filled 2019-03-29: qty 100

## 2019-03-29 MED ORDER — LIDOCAINE HCL (CARDIAC) PF 100 MG/5ML IV SOSY
PREFILLED_SYRINGE | INTRAVENOUS | Status: DC | PRN
Start: 2019-03-29 — End: 2019-03-29
  Administered 2019-03-29: 100 mg via INTRAVENOUS

## 2019-03-29 MED ORDER — SEVOFLURANE IN SOLN
RESPIRATORY_TRACT | Status: AC
  Filled 2019-03-29: qty 250

## 2019-03-29 MED ORDER — PROPOFOL 10 MG/ML IV BOLUS
INTRAVENOUS | Status: AC
  Filled 2019-03-29: qty 20

## 2019-03-29 MED ORDER — FENTANYL CITRATE (PF) 100 MCG/2ML IJ SOLN
25.0000 ug | INTRAMUSCULAR | Status: AC | PRN
  Administered 2019-03-29 (×4): 25 ug via INTRAVENOUS

## 2019-03-29 MED ORDER — SUGAMMADEX SODIUM 500 MG/5ML IV SOLN
INTRAVENOUS | Status: AC
  Filled 2019-03-29: qty 5

## 2019-03-29 MED ORDER — KETAMINE HCL 10 MG/ML IJ SOLN
INTRAMUSCULAR | Status: DC | PRN
  Administered 2019-03-29: 20 mg via INTRAVENOUS

## 2019-03-29 MED ORDER — PROPOFOL 10 MG/ML IV BOLUS
INTRAVENOUS | Status: DC | PRN
  Administered 2019-03-29: 30 mg via INTRAVENOUS
  Administered 2019-03-29: 200 mg via INTRAVENOUS
  Administered 2019-03-29: 20 mg via INTRAVENOUS

## 2019-03-29 MED ORDER — ACETAMINOPHEN 10 MG/ML IV SOLN
INTRAVENOUS | Status: DC | PRN
Start: 2019-03-29 — End: 2019-03-29
  Administered 2019-03-29: 1000 mg via INTRAVENOUS

## 2019-03-29 MED ORDER — BUPIVACAINE-EPINEPHRINE (PF) 0.5% -1:200000 IJ SOLN
INTRAMUSCULAR | Status: DC | PRN
  Administered 2019-03-29: 10 mL

## 2019-03-29 MED ORDER — CEFAZOLIN SODIUM-DEXTROSE 2-4 GM/100ML-% IV SOLN
INTRAVENOUS | Status: AC
  Filled 2019-03-29: qty 100

## 2019-03-29 MED ORDER — PROMETHAZINE HCL 25 MG/ML IJ SOLN: 6.2500 mg | INTRAMUSCULAR | Status: DC | PRN

## 2019-03-29 SURGICAL SUPPLY — 86 items
2.4 drill bit ×3 IMPLANT
3.0 tap ×2 IMPLANT
BASIN GRAD PLASTIC 32OZ STRL (MISCELLANEOUS) ×3 IMPLANT
BIT DRILL 2.4 F/3.5 SCRW (BIT) ×3 IMPLANT
BLADE BOVIE TIP EXT 4 (BLADE) ×3 IMPLANT
BLADE SURG 15 STRL LF DISP TIS (BLADE) ×1 IMPLANT
BLADE SURG 15 STRL SS (BLADE) ×2
BULB RESERV EVAC DRAIN JP 100C (MISCELLANEOUS) ×3 IMPLANT
BUR NEURO DRILL SOFT 3.0X3.8M (BURR) ×3 IMPLANT
CANISTER SUCT 1200ML W/VALVE (MISCELLANEOUS) ×6 IMPLANT
CHLORAPREP W/TINT 26 (MISCELLANEOUS) ×3 IMPLANT
COUNTER NEEDLE 20/40 LG (NEEDLE) ×3 IMPLANT
COVER BACK TABLE REUSABLE LG (DRAPES) ×3 IMPLANT
COVER LIGHT HANDLE STERIS (MISCELLANEOUS) ×6 IMPLANT
COVER WAND RF STERILE (DRAPES) ×3 IMPLANT
CRADLE LAMINECT ARM (MISCELLANEOUS) ×3 IMPLANT
CUP MEDICINE 2OZ PLAST GRAD ST (MISCELLANEOUS) ×3 IMPLANT
DERMABOND ADVANCED (GAUZE/BANDAGES/DRESSINGS) ×2
DERMABOND ADVANCED .7 DNX12 (GAUZE/BANDAGES/DRESSINGS) ×1 IMPLANT
DRAIN CHANNEL JP 10F RND 20C F (MISCELLANEOUS) ×3 IMPLANT
DRAPE 3/4 80X56 (DRAPES) ×3 IMPLANT
DRAPE C-ARM 42X72 X-RAY (DRAPES) ×6 IMPLANT
DRAPE C-ARM XRAY 36X54 (DRAPES) ×3 IMPLANT
DRAPE INCISE IOBAN 66X45 STRL (DRAPES) ×3 IMPLANT
DRAPE LAPAROTOMY 100X77 ABD (DRAPES) ×3 IMPLANT
DRAPE MICROSCOPE SPINE 48X150 (DRAPES) ×3 IMPLANT
DRAPE POUCH INSTRU U-SHP 10X18 (DRAPES) ×3 IMPLANT
DRAPE SURG 17X11 SM STRL (DRAPES) ×12 IMPLANT
DRSG TEGADERM 4X4.75 (GAUZE/BANDAGES/DRESSINGS) ×3 IMPLANT
DRSG TELFA 3X8 NADH (GAUZE/BANDAGES/DRESSINGS) ×3 IMPLANT
ELECT CAUTERY BLADE TIP 2.5 (TIP) ×3
ELECTRODE CAUTERY BLDE TIP 2.5 (TIP) ×1 IMPLANT
FEE INTRAOP MONITOR IMPULS NCS (MISCELLANEOUS) IMPLANT
FRAME EYE SHIELD (PROTECTIVE WEAR) ×3 IMPLANT
GAUZE SPONGE 4X4 12PLY STRL (GAUZE/BANDAGES/DRESSINGS) ×3 IMPLANT
GAUZE XEROFORM 1X8 LF (GAUZE/BANDAGES/DRESSINGS) ×9 IMPLANT
GLOVE BIOGEL PI IND STRL 7.0 (GLOVE) ×1 IMPLANT
GLOVE BIOGEL PI INDICATOR 7.0 (GLOVE) ×2
GLOVE SURG SYN 7.0 (GLOVE) ×6 IMPLANT
GLOVE SURG SYN 8.5  E (GLOVE) ×6
GLOVE SURG SYN 8.5 E (GLOVE) ×3 IMPLANT
GOWN SRG XL LVL 3 NONREINFORCE (GOWNS) ×1 IMPLANT
GOWN STRL NON-REIN TWL XL LVL3 (GOWNS) ×2
GOWN STRL REUS W/ TWL LRG LVL3 (GOWN DISPOSABLE) ×1 IMPLANT
GOWN STRL REUS W/TWL LRG LVL3 (GOWN DISPOSABLE) ×2
GOWN STRL REUS W/TWL MED LVL3 (GOWN DISPOSABLE) ×3 IMPLANT
GRADUATE 1200CC STRL 31836 (MISCELLANEOUS) ×3 IMPLANT
GRAFT DURAGEN MATRIX 1WX1L (Tissue) ×3 IMPLANT
HEMOSTAT SURGICEL 4X8 (HEMOSTASIS) ×3 IMPLANT
INTRAOP MONITOR FEE IMPULS NCS (MISCELLANEOUS)
INTRAOP MONITOR FEE IMPULSE (MISCELLANEOUS)
KIT TURNOVER KIT A (KITS) ×3 IMPLANT
MARKER SKIN DUAL TIP RULER LAB (MISCELLANEOUS) ×6 IMPLANT
NEEDLE HYPO 22GX1.5 SAFETY (NEEDLE) ×3 IMPLANT
NEEDLE SPNL 18GX3.5 QUINCKE PK (NEEDLE) ×3 IMPLANT
NS IRRIG 1000ML POUR BTL (IV SOLUTION) ×6 IMPLANT
PACK LAMINECTOMY NEURO (CUSTOM PROCEDURE TRAY) ×3 IMPLANT
PATTIES SURGICAL .5 X.5 (GAUZE/BANDAGES/DRESSINGS) ×9 IMPLANT
PIN CASPAR 14 (PIN) ×1 IMPLANT
PIN CASPAR 14MM (PIN) ×3
PIN MAYFIELD SKULL DISP (PIN) ×3 IMPLANT
PUTTY DBX 10CC (Bone Implant) ×3 IMPLANT
ROD TI 4.0X40 LOR (Rod) ×6 IMPLANT
SCREW 4.0 PLY 3.5X14 (Screw) ×3 IMPLANT
SCREW 4.0 PLY 3.5X16 (Screw) ×3 IMPLANT
SCREW SET SPINAL (Screw) ×15 IMPLANT
SCREW SYMPH 3.5X30 SMTSHANK (Screw) ×1 IMPLANT
SCREW SYMPHONY 3.5X26 SMTSHANK (Screw) ×3 IMPLANT
SCREW SYMPHONY 3.5X30 SMTSHANK (Screw) ×5 IMPLANT
SPOGE SURGIFLO 8M (HEMOSTASIS) ×16
SPONGE KITTNER 5P (MISCELLANEOUS) IMPLANT
SPONGE SURGIFLO 8M (HEMOSTASIS) ×8 IMPLANT
STAPLER SKIN PROX 35W (STAPLE) ×9 IMPLANT
SUT PROLENE 6 0 CC (SUTURE) ×3 IMPLANT
SUT V-LOC 90 ABS DVC 3-0 CL (SUTURE) ×3 IMPLANT
SUT VIC AB 0 CT1 27 (SUTURE) ×4
SUT VIC AB 0 CT1 27XCR 8 STRN (SUTURE) ×2 IMPLANT
SUT VIC AB 2-0 CT1 18 (SUTURE) IMPLANT
SUT VIC AB 3-0 SH 8-18 (SUTURE) ×6 IMPLANT
SYR 30ML LL (SYRINGE) ×3 IMPLANT
TAP SPINAL 3.0 (ORTHOPEDIC DISPOSABLE SUPPLIES) ×3 IMPLANT
TAPE CLOTH 3X10 WHT NS LF (GAUZE/BANDAGES/DRESSINGS) ×3 IMPLANT
TOWEL OR 17X26 4PK STRL BLUE (TOWEL DISPOSABLE) ×9 IMPLANT
TRAY FOLEY MTR SLVR 16FR STAT (SET/KITS/TRAYS/PACK) ×3 IMPLANT
TUBING CONNECTING 10 (TUBING) ×2 IMPLANT
TUBING CONNECTING 10' (TUBING) ×1

## 2019-03-29 NOTE — Progress Notes (Signed)
PT Cancellation Note  Patient Details Name: Rudolph Dobler MRN: [id number] DOB: 08/23/1955   Cancelled Treatment:    Reason Eval/Treat Not Completed: Other (comment): Per nursing pt scheduled for surgery later this date.  Will attempt to see pt at a future date/time post surgery as medically appropriate upon receipt of new or continue at transfer PT orders.     Ovidio Hanger PT, DPT 03/29/19, 9:18 AM

## 2019-03-29 NOTE — Transfer of Care (Signed)
Immediate Anesthesia Transfer of Care Note  Patient: Mary Montes  Procedure(s) Performed: Procedure(s): POSTERIOR CERVICAL FUSION/FORAMINOTOMY LEVEL C1-C3 (N/A)  Patient Location: PACU  Anesthesia Type:General  Level of Consciousness: sedated  Airway & Oxygen Therapy: Patient Spontanous Breathing and Patient connected to face mask oxygen  Post-op Assessment: Report given to RN and Post -op Vital signs reviewed and stable  Post vital signs: Reviewed and stable  Last Vitals:  Vitals:   03/29/19 1658 03/29/19 2244  BP: 139/88 137/89  Pulse: 88 (!) 119  Resp: 16 20  Temp: 36.6 C 37 C  SpO2: 97% 99%    Complications: No apparent anesthesia complications

## 2019-03-29 NOTE — Anesthesia Preprocedure Evaluation (Signed)
Anesthesia Evaluation  Patient identified by MRN, date of birth, ID band Patient awake    Reviewed: Allergy & Precautions, H&P , NPO status , Patient's Chart, lab work & pertinent test results, reviewed documented beta blocker date and time   History of Anesthesia Complications Negative for: history of anesthetic complications  Airway Mallampati: III  TM Distance: >3 FB Neck ROM: full    Dental  (+) Dental Advidsory Given, Teeth Intact, Chipped   Pulmonary neg shortness of breath, asthma , neg COPD, neg recent URI,    Pulmonary exam normal        Cardiovascular Exercise Tolerance: Good hypertension, (-) angina(-) Past MI and (-) Cardiac Stents negative cardio ROS Normal cardiovascular exam(-) dysrhythmias (-) Valvular Problems/Murmurs     Neuro/Psych negative neurological ROS  negative psych ROS   GI/Hepatic negative GI ROS, Neg liver ROS,   Endo/Other  diabetes (borderline)  Renal/GU negative Renal ROS  negative genitourinary   Musculoskeletal   Abdominal   Peds  Hematology negative hematology ROS (+)   Anesthesia Other Findings Past Medical History: No date: Asthma     Comment:  no attack in over 5 years No date: Hypertension No date: Pre-diabetes     Comment:  off of all oral diabetic meds as A1C is 5.5   Reproductive/Obstetrics negative OB ROS                             Anesthesia Physical Anesthesia Plan  ASA: II  Anesthesia Plan: General   Post-op Pain Management:    Induction: Intravenous and Rapid sequence  PONV Risk Score and Plan: 3 and Ondansetron, Dexamethasone, Midazolam and Treatment may vary due to age or medical condition  Airway Management Planned: Oral ETT  Additional Equipment:   Intra-op Plan:   Post-operative Plan: Extubation in OR  Informed Consent: I have reviewed the patients History and Physical, chart, labs and discussed the procedure  including the risks, benefits and alternatives for the proposed anesthesia with the patient or authorized representative who has indicated his/her understanding and acceptance.     Dental Advisory Given  Plan Discussed with: Anesthesiologist, CRNA and Surgeon  Anesthesia Plan Comments:         Anesthesia Quick Evaluation

## 2019-03-29 NOTE — Anesthesia Procedure Notes (Signed)
Procedure Name: Intubation Date/Time: 03/29/2019 5:45 PM Performed by: Lynden Oxford, CRNA Pre-anesthesia Checklist: Patient identified, Emergency Drugs available, Suction available and Patient being monitored Patient Re-evaluated:Patient Re-evaluated prior to induction Oxygen Delivery Method: Circle system utilized Preoxygenation: Pre-oxygenation with 100% oxygen Induction Type: IV induction Ventilation: Mask ventilation without difficulty and Mask ventilation with difficulty Laryngoscope Size: Glidescope and 3 Grade View: Grade I Tube type: Oral Tube size: 7.5 mm Number of attempts: 1 Airway Equipment and Method: Stylet,  Oral airway and Video-laryngoscopy Placement Confirmation: ETT inserted through vocal cords under direct vision,  positive ETCO2 and breath sounds checked- equal and bilateral Secured at: 19 cm Tube secured with: Tape Dental Injury: Teeth and Oropharynx as per pre-operative assessment  Comments: C-collar in place for intubation, in-line stabilization maintained with no movement of head or neck during intubation, glidescope LoPro S3 utilized.

## 2019-03-29 NOTE — Progress Notes (Signed)
OT Cancellation Note  Patient Details Name: Mary Montes MRN: [id number] DOB: 03-Apr-1955   Cancelled Treatment:    Reason Eval/Treat Not Completed: Patient not medically ready;Other (comment). Thank you for the OT consult. Order received and chart reviewed. Pt noted to be pending potential spinal surgery this date. Will hold OT evaluation until after sx and initiate services as pt medically appropriate for OT evaluation. Please note, if pt undergoes general anesthesia, will require continue at transfer or new OT orders prior to initiating services. Thank you.   Rockney Ghee, M.S., OTR/L Ascom: [phone] 03/29/19, 8:33 AM

## 2019-03-29 NOTE — Interval H&P Note (Signed)
History and Physical Interval Note:  03/29/2019 5:20 PM  Mary Montes  has presented today for surgery, with the diagnosis of C2 fracture.  The various methods of treatment have been discussed with the patient and family. After consideration of risks, benefits and other options for treatment, the patient has consented to  Procedure(s): POSTERIOR CERVICAL FUSION/FORAMINOTOMY LEVEL 2 (N/A) as a surgical intervention.  The patient's history has been reviewed, patient examined, no change in status, stable for surgery.  I have reviewed the patient's chart and labs.  Questions were answered to the patient's satisfaction.     Matthew Pais  Heart sounds normal no MRG. Chest Clear to Auscultation Bilaterally.

## 2019-03-29 NOTE — Plan of Care (Signed)
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
## 2019-03-30 MED ORDER — ACETAMINOPHEN 500 MG PO TABS
1000.0000 mg | ORAL_TABLET | Freq: Four times a day (QID) | ORAL | Status: DC
  Administered 2019-03-30 – 2019-04-02 (×10): 1000 mg via ORAL
  Filled 2019-03-30 (×13): qty 2

## 2019-03-30 MED ORDER — METHOCARBAMOL 1000 MG/10ML IJ SOLN
500.0000 mg | Freq: Four times a day (QID) | INTRAVENOUS | Status: DC
  Filled 2019-03-30: qty 5

## 2019-03-30 MED ORDER — DIAZEPAM 5 MG PO TABS
5.0000 mg | ORAL_TABLET | Freq: Two times a day (BID) | ORAL | Status: DC | PRN
  Administered 2019-04-02: 5 mg via ORAL
  Filled 2019-03-30: qty 1

## 2019-03-30 MED ORDER — OXYCODONE HCL 5 MG PO TABS
5.0000 mg | ORAL_TABLET | ORAL | Status: DC | PRN
  Administered 2019-03-30 – 2019-03-31 (×4): 5 mg via ORAL
  Administered 2019-03-31 – 2019-04-01 (×4): 10 mg via ORAL
  Administered 2019-04-01 – 2019-04-02 (×2): 5 mg via ORAL
  Filled 2019-03-30: qty 2
  Filled 2019-03-30: qty 1
  Filled 2019-03-30: qty 2
  Filled 2019-03-30 (×2): qty 1
  Filled 2019-03-30: qty 2
  Filled 2019-03-30: qty 1
  Filled 2019-03-30: qty 2
  Filled 2019-03-30 (×3): qty 1

## 2019-03-30 MED ORDER — KETOROLAC TROMETHAMINE 15 MG/ML IJ SOLN
15.0000 mg | Freq: Four times a day (QID) | INTRAMUSCULAR | Status: AC
  Administered 2019-03-30 – 2019-04-01 (×6): 15 mg via INTRAVENOUS
  Filled 2019-03-30 (×6): qty 1

## 2019-03-30 MED ORDER — METHOCARBAMOL 500 MG PO TABS
1000.0000 mg | ORAL_TABLET | Freq: Four times a day (QID) | ORAL | Status: DC
  Administered 2019-03-30 – 2019-04-02 (×14): 1000 mg via ORAL
  Filled 2019-03-30 (×14): qty 2

## 2019-03-30 MED ORDER — KETOROLAC TROMETHAMINE 30 MG/ML IJ SOLN
30.0000 mg | Freq: Once | INTRAMUSCULAR | Status: AC
  Administered 2019-03-30: 30 mg via INTRAVENOUS
  Filled 2019-03-30: qty 1

## 2019-03-30 MED ORDER — ACETAMINOPHEN 650 MG RE SUPP
650.0000 mg | Freq: Four times a day (QID) | RECTAL | Status: DC
  Administered 2019-04-02: 12:00:00 650 mg via RECTAL

## 2019-03-30 NOTE — Anesthesia Postprocedure Evaluation (Signed)
Anesthesia Post Note  Patient: Mary Montes  Procedure(s) Performed: POSTERIOR CERVICAL FUSION/FORAMINOTOMY LEVEL C1-C3 (N/A )  Patient location during evaluation: PACU Anesthesia Type: General Level of consciousness: awake and alert Pain management: pain level controlled Vital Signs Assessment: post-procedure vital signs reviewed and stable Respiratory status: spontaneous breathing, nonlabored ventilation, respiratory function stable and patient connected to nasal cannula oxygen Cardiovascular status: blood pressure returned to baseline and stable Postop Assessment: no apparent nausea or vomiting Anesthetic complications: no     Last Vitals:  Vitals:   03/30/19 0152 03/30/19 0251  BP: (!) 167/98 (!) 175/97  Pulse: (!) 107 (!) 105  Resp: 16 17  Temp: 37 C 37.1 C  SpO2: 100% 100%    Last Pain:  Vitals:   03/30/19 0251  TempSrc: Oral  PainSc:                  Lenard Simmer
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
## 2019-03-30 NOTE — Progress Notes (Addendum)
OT Cancellation Note  Patient Details Name: Mary Montes MRN: [id number] DOB: 1955-11-03   Cancelled Treatment:    Reason Eval/Treat Not Completed: Pain limiting ability to participate. Consult received, chart reviewed. RN in with pt providing medication. Pt with significant pain. Will hold and re-attempt at later time to support improved pain control prior to mobility/ADL attempts.   Addendum, 9:27am: On 2nd attempt, pt with PT for assessment, continues to have significant pain. Will re-attempt as able.  Richrd Prime, MPH, MS, OTR/L ascom [phone] 03/30/19, 8:45 AM
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
## 2019-03-30 NOTE — Progress Notes (Signed)
Pain control remains an issue this shift. Pain managed with round the clock PRN p.o. and IV meds. Dressing to posterior neck intact, cervical collar in place. Unable to tolerate 45 deg, bed at 38 deg, which is tolerable for pt. All safety measures in place, will continue to monitor.

## 2019-03-30 NOTE — Progress Notes (Signed)
Pt received from previous shift; Pt alert and oriented x4; aspen collar in place, pt HOB at 38 degrees, unable to tolerate 45 degrees as ordered; Pt neuro checks WDL, no deficits noted at this time, Vitals stable. Pt pain controlled, reports no pain at this time.
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
## 2019-03-31 ENCOUNTER — Inpatient Hospital Stay: Payer: Federal, State, Local not specified - PPO

## 2019-03-31 IMAGING — CR DG CERVICAL SPINE 2 OR 3 VIEWS
1 series · 4 of 4 positions shown · non-contrast
Comparison: Intraoperative radiographs of the cervical
spine-[DATE] [DATE]

CLINICAL DATA: Post spinal surgery on [DATE].

EXAM:
CERVICAL SPINE - 2-3 VIEW

[Series 1: dg cervical spine 2 or 3 views · 0.14mm/px · 4 of 4 slices shown]
[im 1/4]
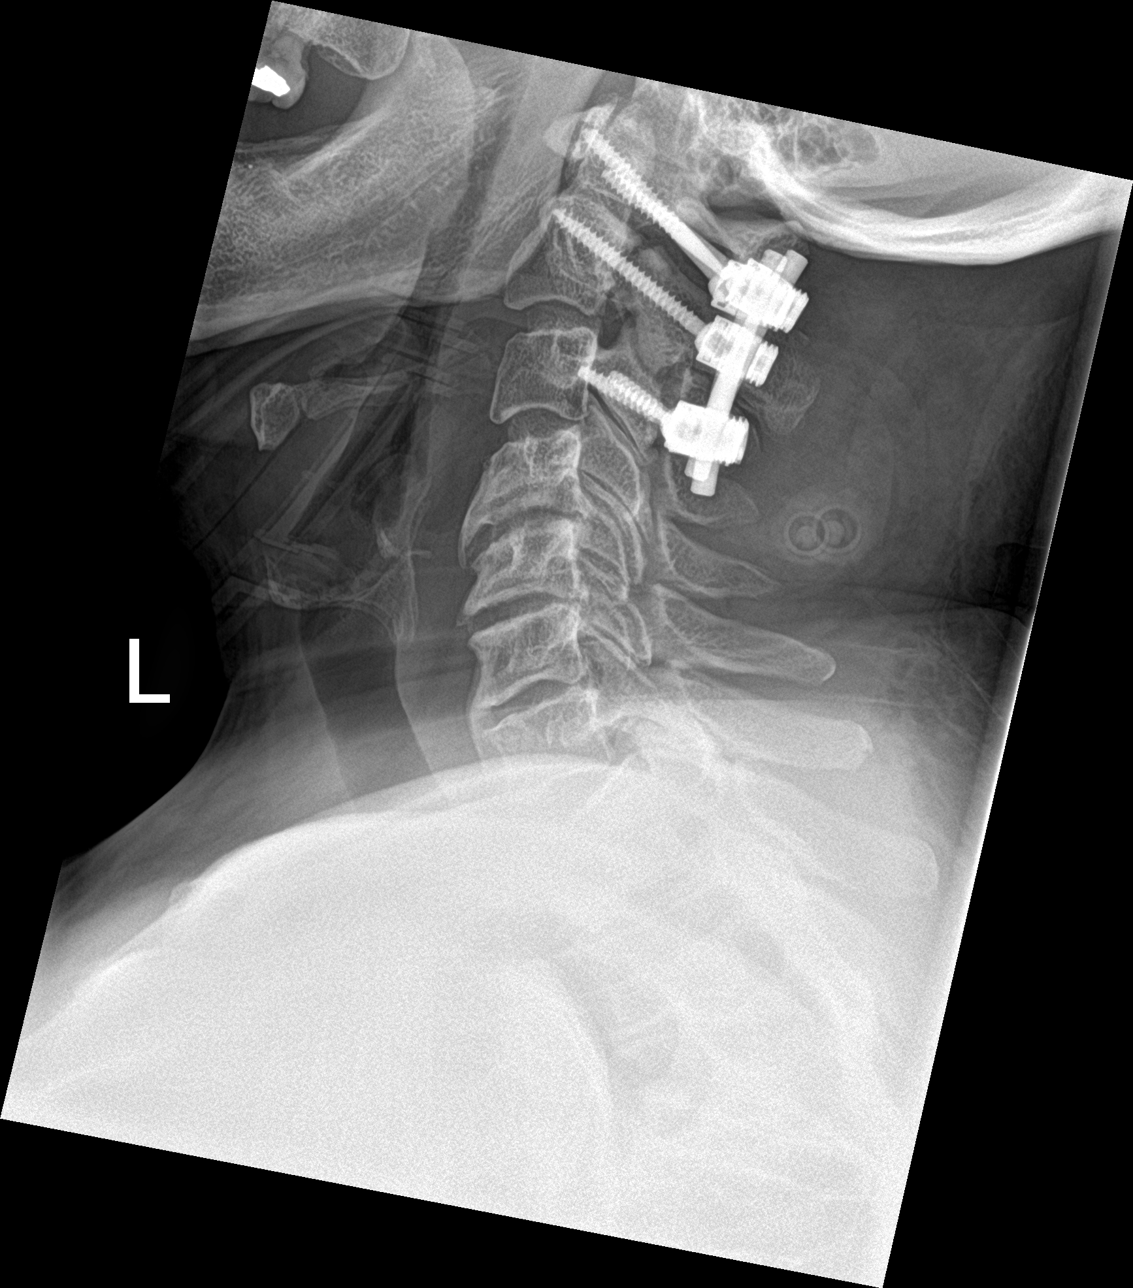
[im 2/4]
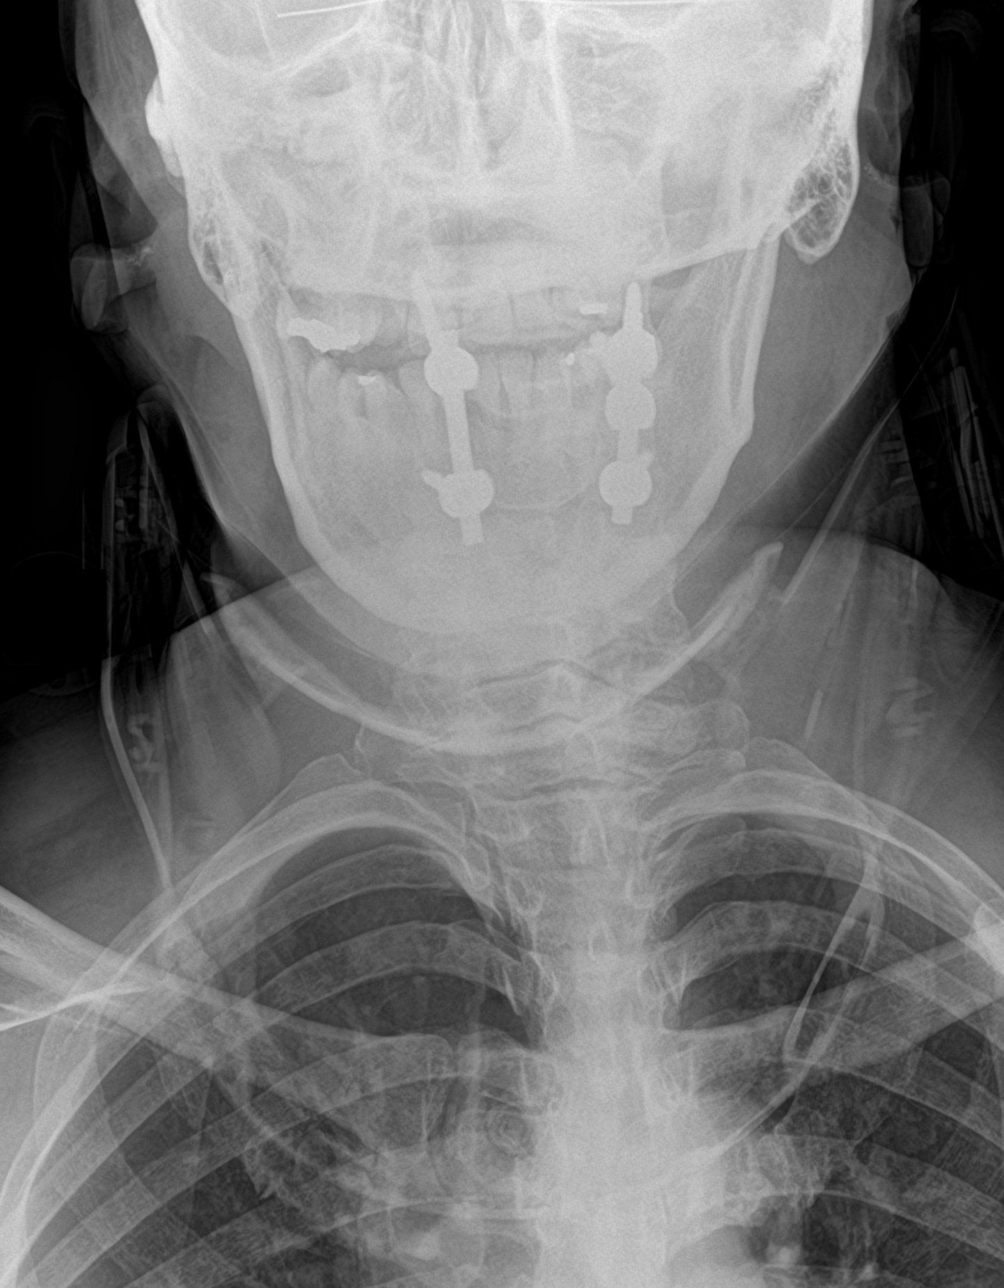
[im 3/4]
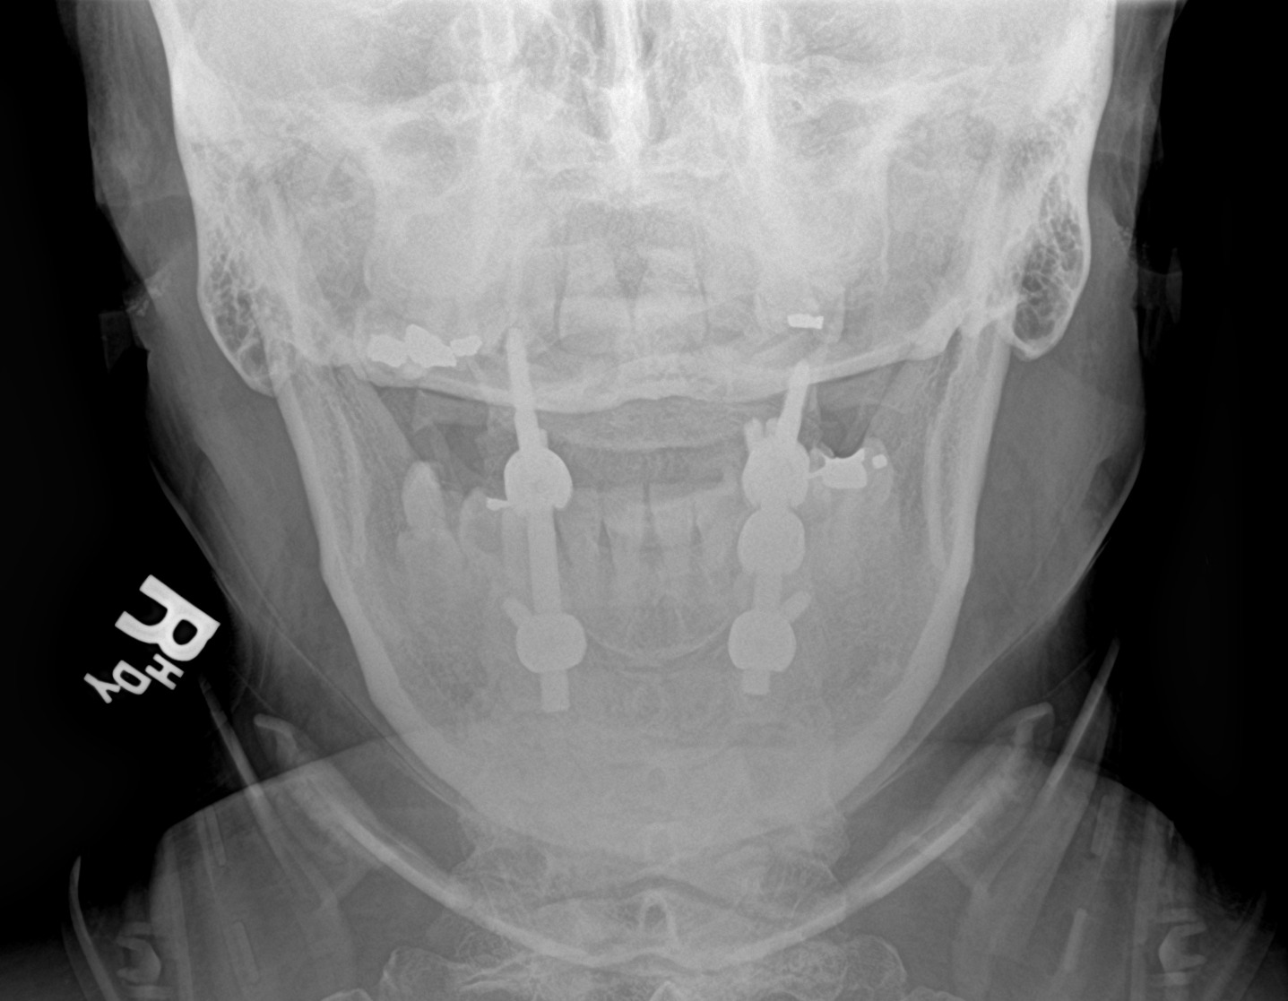
[im 4/4]
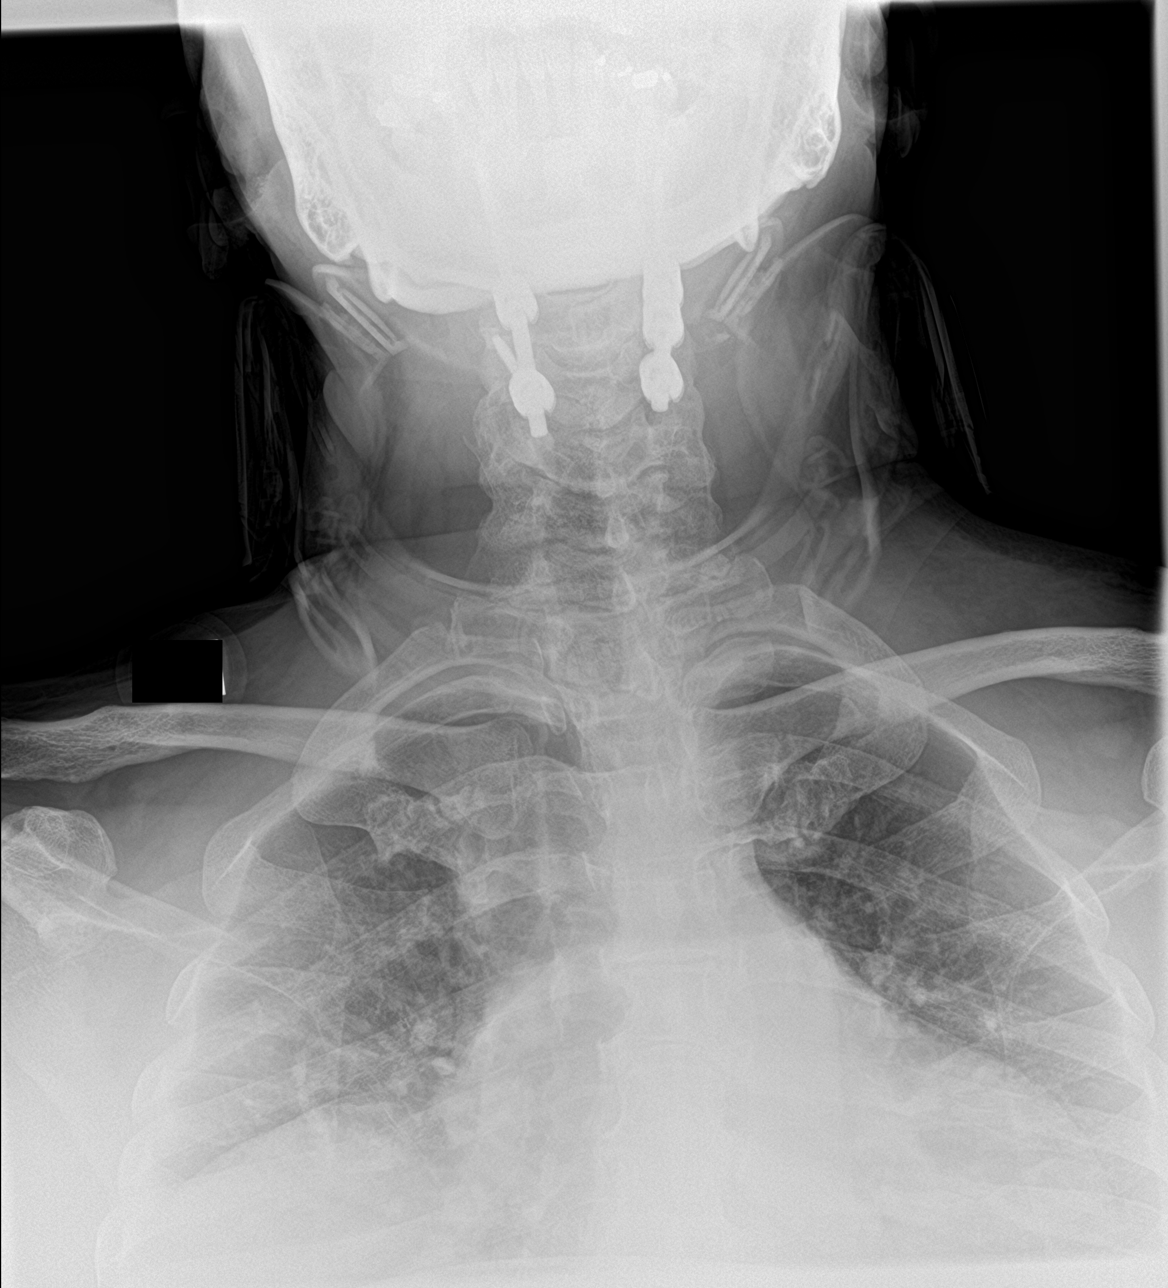

[4 of 4 positions shown; findings below may reference images not displayed]

FINDINGS: C1 to the superior endplate of C7 is imaged on the provided lateral
radiograph.

Post C1-C3 posterior paraspinal fusion with pedicular screws seen
bilaterally at C1 and C3 and left-sided pedicular screw involving
C2. Alignment appears anatomic. No evidence of hardware failure or
loosening.

There is a minimal amount of expected prevertebral soft tissue
swelling about the operative site. No subcutaneous emphysema. No
radiopaque foreign body.

Normal alignment of the cervical spine. No anterolisthesis or
retrolisthesis. The dens appears normally positioned between the
lateral masses of C1.

Cervical vertebral body heights are preserved.

Mild to moderate multilevel cervical spine DDD, worse at C4-C5 and
C5-C6 with disc space height loss, endplate irregularity and
sclerosis.

Regional soft tissues appear normal. Limited visualization of the
lung apices is normal.
IMPRESSION: 1. Post C1-C3 paraspinal fusion without evidence of complication,
hardware failure or loosening.
2. Mild to moderate multilevel cervical spine DDD, worse at C4-C5
and C5-C6.
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
## 2019-04-01 MED ORDER — ENOXAPARIN SODIUM 40 MG/0.4ML ~~LOC~~ SOLN
40.0000 mg | SUBCUTANEOUS | Status: DC
  Administered 2019-04-01 – 2019-04-02 (×2): 40 mg via SUBCUTANEOUS
  Filled 2019-04-01 (×2): qty 0.4

## 2019-04-01 MED ORDER — IBUPROFEN 400 MG PO TABS
400.0000 mg | ORAL_TABLET | Freq: Four times a day (QID) | ORAL | Status: DC | PRN
  Administered 2019-04-01: 400 mg via ORAL
  Filled 2019-04-01: qty 1
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
## 2019-04-01 NOTE — Progress Notes (Signed)
PT Cancellation Note  Patient Details Name: Mary Montes MRN: [id number] DOB: 1955-07-16   Cancelled Treatment:    Reason Eval/Treat Not Completed: Pain limiting ability to participate   Pt resting in bed, reports continued pain.  Only tolerated about 15 minutes up in chair this AM before needing to lay back down. She refuses session this pm due to pain.  Attempted to discuss discharge plan given continued pain affecting mobility and activity tolerance.  Pt stated that she could not go home and could not take care of herself.  When I tried to discuss rehab, she stated quickly that she did not want to go out of fear of Covid and would not listen to risks/benefits "I don't want to."  She refused attempts at mobility this pm.  Will continue as appropriate.   Danielle Dess 04/01/2019, 1:22 PM
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
## 2019-04-02 MED ORDER — METHOCARBAMOL 500 MG PO TABS: 1000.0000 mg | ORAL_TABLET | Freq: Four times a day (QID) | ORAL | 0 refills | Status: DC

## 2019-04-02 MED ORDER — ACETAMINOPHEN 500 MG PO TABS: 1000.0000 mg | ORAL_TABLET | Freq: Three times a day (TID) | ORAL | 0 refills | Status: DC | PRN

## 2019-04-02 MED ORDER — OXYCODONE HCL 5 MG PO TABS: 5.0000 mg | ORAL_TABLET | ORAL | 0 refills | Status: DC | PRN

## 2019-04-02 MED ORDER — DIAZEPAM 5 MG PO TABS: 5.0000 mg | ORAL_TABLET | Freq: Two times a day (BID) | ORAL | 0 refills | Status: DC | PRN

## 2019-04-02 MED ORDER — POLYETHYLENE GLYCOL 3350 17 G PO PACK: 17.0000 g | PACK | Freq: Every day | ORAL | 0 refills | Status: DC

## 2019-04-02 MED ORDER — SENNA 8.6 MG PO TABS: 1.0000 | ORAL_TABLET | Freq: Two times a day (BID) | ORAL | 0 refills | Status: DC

## 2019-04-02 MED ORDER — BISACODYL 10 MG RE SUPP
10.0000 mg | Freq: Once | RECTAL | Status: AC
  Administered 2019-04-02: 14:00:00 10 mg via RECTAL
  Filled 2019-04-02: qty 1

## 2019-04-02 NOTE — Discharge Instructions (Signed)
NEUROSURGERY DISCHARGE INSTRUCTIONS  The following are instructions to help in your recovery once you have been discharged from the hospital. Even if you feel well, it is important that you follow these activity guidelines.  What to do after you leave the hospital:  Recommended diet:  Increase protein intake to promote wound healing. You may return to your usual diet. However, you may experience discomfort when swallowing in the first month after your surgery. This is normal. You may find that softer foods are more comfortable for you to swallow. Be sure to stay hydrated.   Recommended activity: No bending, lifting, or twisting ("BLT"). Avoid lifting objects heavier than 10 pounds (gallon milk jug). Where possible, avoid household activities that involve lifting, bending, reaching, pushing, or pulling such as laundry, vacuuming, grocery shopping, and childcare. Try to arrange for help from friends and family for these activities while you heal.   Increase physical activity slowly as tolerated. Taking short walks is encouraged, but avoid strenuous exercise. Do not jog, run, bicycle, lift weights, or participate in any other exercises unless specifically allowed by your doctor.   You should not drive until cleared by your doctor.   Until released by your doctor, you should not return to work or school. You should rest at home and let your body heal.   You may shower the day after your surgery. After showering, lightly dab your incision dry. Do not take a tub bath or go swimming until approved by your doctor at your follow-up appointment.   If you smoke, we strongly recommend that you quit. Smoking has been proven to interfere with normal bone healing and will dramatically reduce the success rate of your surgery. Please contact [facility] (800-QUIT-NOW) and use the resources at www.[facility].com for assistance in stopping smoking.   Medications  Do not restart Aspirin until seven days after  surgery  * Do not take anti-inflammatory medications for 3 days after surgery (naproxen [Aleve], ibuprofen [Advil, Motrin], celecoxib [Celebrex], etc.).   You may restart home medications.   Wound Care Instructions  If you have a dressing on your incision, remove it two days after your surgery. Keep your incision area clean and dry.   If you have staples or stitches on your incision, you should have a follow up scheduled for removal. If you do not have staples or stitches, you will have steri-strips (small pieces of surgical tape) or Dermabond glue. The steri-strips/glue should begin to peel away within about a week (it is fine if the steri-strips fall off before then). If the strips are still in place one week after your surgery, you may gently remove them.    Please Report any of the following: Should you experience any of the following, contact us immediately:   New numbness or weakness   Pain that is progressively getting worse, and is not relieved by your pain medication, muscle relaxers, rest, and warm compresses   Bleeding, redness, swelling, pain, or drainage from surgical incision   Chills or flu-like symptoms   Fever greater than 101.0 F (38.3 C)   Inability to eat, drink fluids, or take medications   Problems with bowel or bladder functions   Difficulty breathing or shortness of breath   Warmth, tenderness, or swelling in your calf    Additional Follow up appointments During office hours (Monday-Friday 9 am to 5 pm), please call your physician at [phone] and ask for Kendelyn Jean.   After hours and weekends, please call [phone] and an answering   service will put you in touch with either Dr. Cook or Dr. Yarbrough.   For a life-threatening emergency, call 911       

## 2019-04-02 NOTE — Progress Notes (Signed)
Patient c/o Left jaw pain , denies chest pain , states it feels like her jaw " locked". And states her neck is starting to hurt again . Will notify physician

## 2019-04-02 NOTE — Progress Notes (Signed)
PT Cancellation Note  Patient Details Name: Mary Montes MRN: [id number] DOB: [birth year]   Cancelled Treatment:     PT attempt. 2nd attempt this afternoon. Pt was on [facility] first try and now to too fatigued to participate. She politely refused even with encouragement.  Pt has had BM. She states" I'm ready to go home." Therapist reviewed safety concerns and discussed safe management of stairs once d/c home. Pt states understanding. PT will continue to follow per POC   Rushie Chestnut 04/02/2019, 3:24 PM
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
## 2019-04-02 NOTE — Progress Notes (Signed)
Patioent discharged home with d/c papers, Belongings and BSC and walker . Daughter picked her up without incident

## 2019-04-02 NOTE — Addendum Note (Signed)
Addendum  created 04/02/19 0855 by Stormy Fabian, CRNA   Charge Capture section accepted
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
## 2019-04-02 NOTE — Progress Notes (Signed)
OT Cancellation Note  Patient Details Name: Mary Montes MRN: [id number] DOB: 1956/02/08   Cancelled Treatment:    Reason Eval/Treat Not Completed: Other (comment). Upon attempt, pt reporting no pain and having an "okay" day. Pt reports just having a suppository placed by nursing prior to OT's arrival. Denies need to use bathroom yet. Will hold OT tx and re-attempt at later time as appropriate.   Richrd Prime, MPH, MS, OTR/L ascom [phone] 04/02/19, 2:07 PM

## 2019-12-27 ENCOUNTER — Encounter: Payer: Self-pay | Admitting: Neurosurgery

## 2020-01-01 ENCOUNTER — Other Ambulatory Visit: Payer: Self-pay | Admitting: Family Medicine

## 2020-01-01 DIAGNOSIS — Z1231 Encounter for screening mammogram for malignant neoplasm of breast: Secondary | ICD-10-CM

## 2020-11-16 ENCOUNTER — Emergency Department: Payer: Federal, State, Local not specified - PPO

## 2020-11-16 ENCOUNTER — Other Ambulatory Visit: Payer: Self-pay

## 2020-11-16 ENCOUNTER — Emergency Department
Admission: EM | Admit: 2020-11-16 | Discharge: 2020-11-16 | Disposition: A | Discharge status: Home / Self Care | Payer: Federal, State, Local not specified - PPO | Attending: Emergency Medicine | Admitting: Emergency Medicine

## 2020-11-16 DIAGNOSIS — S199XXA Unspecified injury of neck, initial encounter: Secondary | ICD-10-CM | POA: Diagnosis present

## 2020-11-16 DIAGNOSIS — Y92481 Parking lot as the place of occurrence of the external cause: Secondary | ICD-10-CM | POA: Insufficient documentation

## 2020-11-16 DIAGNOSIS — Z79899 Other long term (current) drug therapy: Secondary | ICD-10-CM | POA: Insufficient documentation

## 2020-11-16 DIAGNOSIS — I10 Essential (primary) hypertension: Secondary | ICD-10-CM | POA: Insufficient documentation

## 2020-11-16 DIAGNOSIS — S161XXA Strain of muscle, fascia and tendon at neck level, initial encounter: Secondary | ICD-10-CM | POA: Insufficient documentation

## 2020-11-16 DIAGNOSIS — J45909 Unspecified asthma, uncomplicated: Secondary | ICD-10-CM | POA: Insufficient documentation

## 2020-11-16 DIAGNOSIS — E119 Type 2 diabetes mellitus without complications: Secondary | ICD-10-CM | POA: Insufficient documentation

## 2020-11-16 DIAGNOSIS — Z7984 Long term (current) use of oral hypoglycemic drugs: Secondary | ICD-10-CM | POA: Diagnosis not present
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
## 2020-11-16 MED ORDER — LISINOPRIL-HYDROCHLOROTHIAZIDE 20-25 MG PO TABS: 1.0000 | ORAL_TABLET | Freq: Every day | ORAL | 3 refills | Status: AC

## 2020-11-16 MED ORDER — MELOXICAM 15 MG PO TABS: 15.0000 mg | ORAL_TABLET | Freq: Every day | ORAL | 2 refills | Status: DC

## 2020-11-16 MED ORDER — BACLOFEN 10 MG PO TABS: 10.0000 mg | ORAL_TABLET | Freq: Three times a day (TID) | ORAL | 0 refills | Status: AC
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
## 2021-02-04 ENCOUNTER — Other Ambulatory Visit: Payer: Self-pay | Admitting: Podiatry

## 2021-02-10 ENCOUNTER — Encounter: Payer: Self-pay | Admitting: Podiatry

## 2021-02-15 NOTE — Discharge Instructions (Signed)
[facility]  POST OPERATIVE INSTRUCTIONS FOR DR. FOWLER AND [facility]   Take your medication as prescribed.  Pain medication should be taken only as needed.  Keep the dressing clean, dry and intact.  Keep your foot elevated above the heart level for the first 48 hours.  Walking to the bathroom and brief periods of walking are acceptable, unless we have instructed you to be non-weight bearing.  Always wear your post-op shoe when walking.  Always use your crutches if you are to be non-weight bearing.  Do not take a shower. Baths are permissible as long as the foot is kept out of the water.   Every hour you are awake:  Bend your knee 15 times. Flex foot 15 times Massage calf 15 times  Call [facility] ([phone]) if any of the following problems occur: You develop a temperature or fever. The bandage becomes saturated with blood. Medication does not stop your pain. Injury of the foot occurs. Any symptoms of infection including redness, odor, or red streaks running from wound. 

## 2021-02-17 ENCOUNTER — Encounter: Payer: Self-pay | Admitting: Podiatry

## 2021-02-17 ENCOUNTER — Ambulatory Visit
Admission: RE | Admit: 2021-02-17 | Discharge: 2021-02-17 | Disposition: A | Discharge status: Home / Self Care | Payer: Federal, State, Local not specified - PPO | Source: Ambulatory Visit | Attending: Podiatry | Admitting: Podiatry

## 2021-02-17 ENCOUNTER — Ambulatory Visit: Payer: Self-pay

## 2021-02-17 ENCOUNTER — Encounter
Admission: RE | Disposition: A | Discharge status: Home / Self Care | Payer: Self-pay | Source: Ambulatory Visit | Attending: Podiatry

## 2021-02-17 ENCOUNTER — Ambulatory Visit: Payer: Federal, State, Local not specified - PPO

## 2021-02-17 ENCOUNTER — Ambulatory Visit: Payer: Federal, State, Local not specified - PPO | Admitting: Anesthesiology

## 2021-02-17 ENCOUNTER — Other Ambulatory Visit: Payer: Self-pay

## 2021-02-17 DIAGNOSIS — M2012 Hallux valgus (acquired), left foot: Secondary | ICD-10-CM | POA: Diagnosis present

## 2021-02-17 DIAGNOSIS — I10 Essential (primary) hypertension: Secondary | ICD-10-CM | POA: Insufficient documentation

## 2021-02-17 DIAGNOSIS — J45909 Unspecified asthma, uncomplicated: Secondary | ICD-10-CM | POA: Diagnosis not present

## 2021-02-17 DIAGNOSIS — M2042 Other hammer toe(s) (acquired), left foot: Secondary | ICD-10-CM | POA: Insufficient documentation

## 2021-02-17 DIAGNOSIS — Z419 Encounter for procedure for purposes other than remedying health state, unspecified: Secondary | ICD-10-CM | POA: Insufficient documentation

## 2021-02-17 HISTORY — PX: BUNIONECTOMY: SHX129

## 2021-02-17 HISTORY — PX: HAMMER TOE SURGERY: SHX385

## 2021-02-17 HISTORY — PX: HALLUX VALGUS AKIN: SHX6622

## 2021-02-17 HISTORY — DX: Presence of spectacles and contact lenses: Z97.3
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
## 2021-02-17 SURGERY — BUNIONECTOMY
Anesthesia: General | Site: Toe | Laterality: Left

## 2021-02-17 MED ORDER — DEXAMETHASONE SODIUM PHOSPHATE 4 MG/ML IJ SOLN
INTRAMUSCULAR | Status: DC | PRN
  Administered 2021-02-17: 4 mg via INTRAVENOUS

## 2021-02-17 MED ORDER — EPHEDRINE SULFATE 50 MG/ML IJ SOLN
INTRAMUSCULAR | Status: DC | PRN
  Administered 2021-02-17 (×2): 5 mg via INTRAVENOUS
  Administered 2021-02-17: 10 mg via INTRAVENOUS

## 2021-02-17 MED ORDER — FENTANYL CITRATE (PF) 100 MCG/2ML IJ SOLN
INTRAMUSCULAR | Status: DC | PRN
  Administered 2021-02-17 (×2): 25 ug via INTRAVENOUS
  Administered 2021-02-17: 50 ug via INTRAVENOUS

## 2021-02-17 MED ORDER — GLYCOPYRROLATE 0.2 MG/ML IJ SOLN
INTRAMUSCULAR | Status: DC | PRN
  Administered 2021-02-17: .1 mg via INTRAVENOUS

## 2021-02-17 MED ORDER — ONDANSETRON HCL 4 MG/2ML IJ SOLN
INTRAMUSCULAR | Status: DC | PRN
  Administered 2021-02-17: 4 mg via INTRAVENOUS

## 2021-02-17 MED ORDER — MIDAZOLAM HCL 5 MG/5ML IJ SOLN
INTRAMUSCULAR | Status: DC | PRN
  Administered 2021-02-17: 2 mg via INTRAVENOUS

## 2021-02-17 MED ORDER — PROPOFOL 10 MG/ML IV BOLUS
INTRAVENOUS | Status: DC | PRN
  Administered 2021-02-17: 120 mg via INTRAVENOUS

## 2021-02-17 MED ORDER — SCOPOLAMINE 1 MG/3DAYS TD PT72
1.0000 | MEDICATED_PATCH | Freq: Once | TRANSDERMAL | Status: DC
  Administered 2021-02-17: 12:00:00 1.5 mg via TRANSDERMAL

## 2021-02-17 MED ORDER — CEFAZOLIN SODIUM-DEXTROSE 2-4 GM/100ML-% IV SOLN
2.0000 g | INTRAVENOUS | Status: AC
  Administered 2021-02-17: 12:00:00 2 g via INTRAVENOUS

## 2021-02-17 MED ORDER — BUPIVACAINE HCL (PF) 0.25 % IJ SOLN
INTRAMUSCULAR | Status: DC | PRN
  Administered 2021-02-17: 10 mL

## 2021-02-17 MED ORDER — SODIUM CHLORIDE 0.9 % IR SOLN
Status: DC | PRN
  Administered 2021-02-17: 1000 mL

## 2021-02-17 MED ORDER — LACTATED RINGERS IV SOLN: INTRAVENOUS | Status: DC

## 2021-02-17 MED ORDER — LIDOCAINE HCL (CARDIAC) PF 100 MG/5ML IV SOSY
PREFILLED_SYRINGE | INTRAVENOUS | Status: DC | PRN
  Administered 2021-02-17: 50 mg via INTRATRACHEAL

## 2021-02-17 MED ORDER — OXYCODONE-ACETAMINOPHEN 5-325 MG PO TABS: 1.0000 | ORAL_TABLET | Freq: Four times a day (QID) | ORAL | 0 refills | Status: AC | PRN

## 2021-02-17 MED ORDER — BUPIVACAINE LIPOSOME 1.3 % IJ SUSP
INTRAMUSCULAR | Status: DC | PRN
  Administered 2021-02-17 (×2): 10 mL

## 2021-02-17 SURGICAL SUPPLY — 56 items
APL PRP STRL LF DISP 70% ISPRP (MISCELLANEOUS) ×1
APL SKNCLS STERI-STRIP NONHPOA (GAUZE/BANDAGES/DRESSINGS) ×1
BENZOIN TINCTURE PRP APPL 2/3 (GAUZE/BANDAGES/DRESSINGS) ×3 IMPLANT
BLADE MED AGGRESSIVE (BLADE) ×2 IMPLANT
BLADE OSC/SAGITTAL MD 5.5X18 (BLADE) ×2 IMPLANT
BLADE SAW LAPIPLASTY 40X11 (BLADE) ×2 IMPLANT
BLADE SURG 15 STRL LF DISP TIS (BLADE) IMPLANT
BLADE SURG 15 STRL SS (BLADE) ×6
BNDG CMPR 75X41 PLY HI ABS (GAUZE/BANDAGES/DRESSINGS) ×1
BNDG COHESIVE 4X5 TAN ST LF (GAUZE/BANDAGES/DRESSINGS) ×5 IMPLANT
BNDG CONFORM 4 STRL LF (GAUZE/BANDAGES/DRESSINGS) ×3 IMPLANT
BNDG ELASTIC 4X5.8 VLCR STR LF (GAUZE/BANDAGES/DRESSINGS) ×3 IMPLANT
BNDG ESMARK 4X12 TAN STRL LF (GAUZE/BANDAGES/DRESSINGS) ×3 IMPLANT
BNDG GAUZE ELAST 4 BULKY (GAUZE/BANDAGES/DRESSINGS) ×3 IMPLANT
BNDG STRETCH 4X75 STRL LF (GAUZE/BANDAGES/DRESSINGS) ×3 IMPLANT
BOOT STEPPER DURA MED (SOFTGOODS) ×2 IMPLANT
CANISTER SUCT 1200ML W/VALVE (MISCELLANEOUS) ×3 IMPLANT
CHLORAPREP W/TINT 26 (MISCELLANEOUS) ×2 IMPLANT
CLOSURE WOUND 1/4X4 (GAUZE/BANDAGES/DRESSINGS) ×1
COVER LIGHT HANDLE UNIVERSAL (MISCELLANEOUS) ×6 IMPLANT
DRAPE C-ARM XRAY 36X54 (DRAPES) ×2 IMPLANT
DRAPE FLUOR MINI C-ARM 54X84 (DRAPES) ×3 IMPLANT
ELECT REM PT RETURN 9FT ADLT (ELECTROSURGICAL) ×3
ELECTRODE REM PT RTRN 9FT ADLT (ELECTROSURGICAL) ×1 IMPLANT
FIXATION HAMMERTOE ANGLD 22MM (Toe) IMPLANT
GAUZE SPONGE 4X4 12PLY STRL (GAUZE/BANDAGES/DRESSINGS) ×3 IMPLANT
GAUZE XEROFORM 1X8 LF (GAUZE/BANDAGES/DRESSINGS) ×3 IMPLANT
GLOVE SRG 8 PF TXTR STRL LF DI (GLOVE) ×2 IMPLANT
GLOVE SURG ENC MOIS LTX SZ7.5 (GLOVE) ×6 IMPLANT
GLOVE SURG UNDER POLY LF SZ8 (GLOVE) ×6
GOWN STRL REUS W/ TWL LRG LVL3 (GOWN DISPOSABLE) ×2 IMPLANT
GOWN STRL REUS W/TWL LRG LVL3 (GOWN DISPOSABLE) ×6
HAMMERTOE ANGLED 22MM LRG (Toe) ×3 IMPLANT
K-WIRE DBL END TROCAR 6X.045 (WIRE) ×3
KIT TURNOVER KIT A (KITS) ×3 IMPLANT
KWIRE DBL END TROCAR 6X.045 (WIRE) IMPLANT
LAPIPLASTY SYS 4A (Orthopedic Implant) ×3 IMPLANT
NS IRRIG 500ML POUR BTL (IV SOLUTION) ×3 IMPLANT
PACK EXTREMITY ARMC (MISCELLANEOUS) ×3 IMPLANT
PENCIL SMOKE EVACUATOR (MISCELLANEOUS) ×3 IMPLANT
RASP SM TEAR CROSS CUT (RASP) ×2 IMPLANT
SCREW 2.7 HIGH PITCH LOCKING (Screw) ×2 IMPLANT
SCREW HIGH PITCH LOCK 2.7 (Screw) ×2 IMPLANT
STAPLE NIT SUPER 11X10X10 (Staple) ×2 IMPLANT
STOCKINETTE IMPERVIOUS LG (DRAPES) ×3 IMPLANT
STRIP CLOSURE SKIN 1/4X4 (GAUZE/BANDAGES/DRESSINGS) ×2 IMPLANT
SUT ETHILON 4 0 FS (SUTURE) ×2 IMPLANT
SUT MNCRL 4-0 (SUTURE) ×3
SUT MNCRL 4-0 27XMFL (SUTURE) ×1
SUT VIC AB 3-0 SH 27 (SUTURE) ×3
SUT VIC AB 3-0 SH 27X BRD (SUTURE) IMPLANT
SUT VIC AB 4-0 FS2 27 (SUTURE) ×2 IMPLANT
SUTURE MNCRL 4-0 27XMF (SUTURE) IMPLANT
SYSTEM LAPIPLASTY 4A (Orthopedic Implant) IMPLANT
TUBING CONNECTING 10 (TUBING) ×1 IMPLANT
TUBING CONNECTING 10' (TUBING) ×1

## 2021-02-17 NOTE — Transfer of Care (Signed)
Immediate Anesthesia Transfer of Care Note  Patient: Mary Montes  Procedure(s) Performed: BUNIONECTOMY: LAPIDUS-TYPE (Left: Toe) HAMMER TOE CORRECTION 2nd toe (Left: Toe) HALLUX VALGUS AKIN (Left: Toe)  Patient Location: PACU  Anesthesia Type: General LMA  Level of Consciousness: awake, alert  and patient cooperative  Airway and Oxygen Therapy: Patient Spontanous Breathing and Patient connected to supplemental oxygen  Post-op Assessment: Post-op Vital signs reviewed, Patient's Cardiovascular Status Stable, Respiratory Function Stable, Patent Airway and No signs of Nausea or vomiting  Post-op Vital Signs: Reviewed and stable  Complications: No notable events documented.

## 2021-02-17 NOTE — H&P (Signed)
HISTORY AND PHYSICAL INTERVAL NOTE:  02/17/2021  11:50 AM  Mary Montes  has presented today for surgery, with the diagnosis of M20.12 Hallux valgus, left.  The various methods of treatment have been discussed with the patient.  No guarantees were given.  After consideration of risks, benefits and other options for treatment, the patient has consented to surgery.  I have reviewed the patients chart and labs.     A history and physical examination was performed in my office.  The patient was reexamined.  There have been no changes to this history and physical examination.  Mary Montes A

## 2021-02-17 NOTE — Anesthesia Postprocedure Evaluation (Signed)
Anesthesia Post Note  Patient: Mary Montes  Procedure(s) Performed: BUNIONECTOMY: LAPIDUS-TYPE (Left: Toe) HAMMER TOE CORRECTION 2nd toe (Left: Toe) HALLUX VALGUS AKIN (Left: Toe)     Patient location during evaluation: PACU Anesthesia Type: General Level of consciousness: awake and alert and oriented Pain management: satisfactory to patient Vital Signs Assessment: post-procedure vital signs reviewed and stable Respiratory status: spontaneous breathing, nonlabored ventilation and respiratory function stable Cardiovascular status: blood pressure returned to baseline and stable Postop Assessment: Adequate PO intake and No signs of nausea or vomiting Anesthetic complications: no   No notable events documented.  Cherly Beach

## 2021-02-17 NOTE — Anesthesia Preprocedure Evaluation (Addendum)
Anesthesia Evaluation  Patient identified by MRN, date of birth, ID band Patient awake    Reviewed: Allergy & Precautions, H&P , NPO status , Patient's Chart, lab work & pertinent test results  Airway Mallampati: II  TM Distance: >3 FB Neck ROM: full    Dental  (+) Missing, Loose Right Front tooth loose.  Discussed risks of worsening or loss of tooth due to airway management with patient.:   Pulmonary asthma ,    Pulmonary exam normal breath sounds clear to auscultation       Cardiovascular hypertension, Normal cardiovascular exam Rhythm:regular Rate:Normal  HLD   Neuro/Psych    GI/Hepatic   Endo/Other  Overweight Off DM meds  Renal/GU      Musculoskeletal   Abdominal   Peds  Hematology   Anesthesia Other Findings   Reproductive/Obstetrics                            Anesthesia Physical Anesthesia Plan  ASA: 2  Anesthesia Plan: General LMA   Post-op Pain Management: Minimal or no pain anticipated   Induction:   PONV Risk Score and Plan: 3 and Treatment may vary due to age or medical condition, Scopolamine patch - Pre-op, Ondansetron and Dexamethasone  Airway Management Planned:   Additional Equipment:   Intra-op Plan:   Post-operative Plan:   Informed Consent: I have reviewed the patients History and Physical, chart, labs and discussed the procedure including the risks, benefits and alternatives for the proposed anesthesia with the patient or authorized representative who has indicated his/her understanding and acceptance.     Dental Advisory Given  Plan Discussed with: CRNA  Anesthesia Plan Comments:         Anesthesia Quick Evaluation

## 2021-02-17 NOTE — Anesthesia Procedure Notes (Signed)
Procedure Name: LMA Insertion Date/Time: 02/17/2021 12:12 PM Performed by: Jimmy Picket, CRNA Pre-anesthesia Checklist: Patient identified, Emergency Drugs available, Suction available, Timeout performed and Patient being monitored Patient Re-evaluated:Patient Re-evaluated prior to induction Oxygen Delivery Method: Circle system utilized Preoxygenation: Pre-oxygenation with 100% oxygen Induction Type: IV induction LMA: LMA inserted LMA Size: 4.0 Number of attempts: 1 Placement Confirmation: positive ETCO2 and breath sounds checked- equal and bilateral Tube secured with: Tape

## 2021-02-17 NOTE — Op Note (Addendum)
Operative note   Surgeon:Ashleyann Shoun Lawyer: None    Preop diagnosis: 1.  Hallux valgus deformity left foot  2.  Hammertoe contracture left second toe    Postop diagnosis: Same    Procedure: 1.  Lapidus hallux valgus correction left foot 2.  Akin proximal phalanx osteotomy left foot 3.  Hammertoe repair with PIPJ arthrodesis with hammerlock implant left second toe    EBL: Minimal    Anesthesia:local and general.  Local consisted of a total of 20 cc of Exparel long-acting anesthetic and 10 cc of 0.25% bupivacaine    Hemostasis: Mid calf tourniquet inflated to 200 mmHg for 120-minute    Specimen: None    Complications: None    Operative indications:Mary Montes is an 65 y.o. that presents today for surgical intervention.  The risks/benefits/alternatives/complications have been discussed and consent has been given.    Procedure:  Patient was brought into the OR and placed on the operating table in thesupine position. After anesthesia was obtained theleft lower extremity was prepped and draped in usual sterile fashion.  Attention was directed to the dorsal aspect of the foot where a dorsal incision was made at the first met cuneiforms joint.  Sharp and blunt dissection was carried down to the periosteum.  Subperiosteal dissection was then undertaken.  This exposed the first met cuneiform joint.  This was then freed and loosened.  A small fulcrum was placed between the base of the first metatarsal and second metatarsal.  Next the joint positioner was placed on the medial aspect of the metatarsal.  A small stab incision was made at the second metatarsal.  Compression was placed for the positioner.  Good realignment of the first intermetatarsal angle was noted at this time.  Attention was then directed to the dorsomedial first MTPJ where an incision was performed.  Sharp and blunt dissection was carried down to the capsule.  The intermetatarsal space was then entered.  The  conjoined tendon of the abductor was then freed from the base of the proximal phalanx.  Attention was to redirected to the first met cuneiform joint.  At this time the osteotomy cut guide was placed into the joint region.  2 vertical cuts were then made.  The cartilage material was removed from the first met cuneiform joint and the joint was then prepped with a 2.0 mm drill bit.  The joint compressor was then placed.  Good compression and realignment was noted.  The compression olive wire was then applied.  Next the medial and dorsal locking plates were then placed from the [place] set.  Realignment and stability was noted in all planes.  The compressor and olive wire was removed.  Upon removal of the olive wire the distal aspect of the outer wire was noted to the broken.  Fluoroscopy at this time revealed that the olive wire had broken into the cuneiform. I did not feel removal or attempted removal of this would be beneficial whatsoever as this was completely contained within the body of the cuneiform.  X-rays were obtained for confirmation.  Attention was redirected to the dorsomedial first MTPJ.  A small T capsulotomy was performed.  The dorsomedial eminence was noted and transected and smoothed with a power rasp.  A small capsulorrhaphy was performed.   At this time residual valgus of the great toe was noted and the decision to perform an Akin proximal phalanx osteotomy was performed.  Attention was directed to the midshaft of the proximal phalanx.  A 0.045 guidewire was used for the osteotomy cut.  The apex of the osteotomy was created lateral.  A small wedge was removed.  The K wire was removed and the toe realigned.  A compression staple was used.  Good realignment of the great toe was noted after this.  Closure was then performed after all areas were irrigated.  3-0 Vicryl for the capsular tissue.  4-0 Vicryl in subcutaneous tissue and a 4-0 Monocryl for the skin.   Attention was directed to the  second toe where a dorsal incision was performed overlying the second toe PIPJ.  Sharp and blunt dissection carried down to the extensor tendon.  Tenotomy was performed.  The head of the proximal phalanx and base of the middle phalanx was noted.  This was then excised at the surgical neck and all articular cartilage removed from the base of the middle phalanx.  At this time using standard technique a large straight hammerlock implant was then placed into the PIPJ with good stability noted.  Alignment was noted with fluoroscopy at this time.  At this time the wound was flushed with copious amounts of irrigation.  The extensor tendon was reapproximated with a 4-0 Vicryl as was the subcutaneous tissue.  The skin reapproximated with a 4-0 nylon.  Further local anesthesia was performed at the end of the case.    Patient tolerated the procedure and anesthesia well.  Was transported from the OR to the PACU with all vital signs stable and vascular status intact. To be discharged per routine protocol.  Will follow up in approximately 1 week in the outpatient clinic.

## 2021-02-18 ENCOUNTER — Encounter: Payer: Self-pay | Admitting: Podiatry

## 2021-03-04 ENCOUNTER — Other Ambulatory Visit: Payer: Self-pay | Admitting: Family Medicine

## 2021-03-04 DIAGNOSIS — M5412 Radiculopathy, cervical region: Secondary | ICD-10-CM

## 2021-03-18 ENCOUNTER — Ambulatory Visit
Admission: RE | Admit: 2021-03-18 | Discharge: 2021-03-18 | Disposition: A | Discharge status: Home / Self Care | Payer: Federal, State, Local not specified - PPO | Source: Ambulatory Visit | Attending: Family Medicine | Admitting: Family Medicine

## 2021-03-18 ENCOUNTER — Other Ambulatory Visit: Payer: Self-pay

## 2021-03-18 DIAGNOSIS — M5412 Radiculopathy, cervical region: Secondary | ICD-10-CM
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
## 2022-02-14 ENCOUNTER — Ambulatory Visit
Admission: RE | Admit: 2022-02-14 | Discharge: 2022-02-14 | Disposition: A | Discharge status: Home / Self Care | Payer: Federal, State, Local not specified - PPO | Attending: Nurse Practitioner | Admitting: Nurse Practitioner

## 2022-02-14 ENCOUNTER — Other Ambulatory Visit: Payer: Self-pay | Admitting: Nurse Practitioner

## 2022-02-14 ENCOUNTER — Ambulatory Visit
Admission: RE | Admit: 2022-02-14 | Discharge: 2022-02-14 | Disposition: A | Discharge status: Home / Self Care | Payer: Federal, State, Local not specified - PPO | Source: Ambulatory Visit | Attending: Nurse Practitioner | Admitting: Nurse Practitioner

## 2022-02-14 DIAGNOSIS — M25561 Pain in right knee: Secondary | ICD-10-CM

## 2023-04-18 ENCOUNTER — Emergency Department
Admission: EM | Admit: 2023-04-18 | Discharge: 2023-04-19 | Disposition: A | Discharge status: Home / Self Care | Payer: No Typology Code available for payment source | Attending: Emergency Medicine | Admitting: Emergency Medicine

## 2023-04-18 ENCOUNTER — Other Ambulatory Visit: Payer: Self-pay

## 2023-04-18 ENCOUNTER — Emergency Department: Payer: No Typology Code available for payment source

## 2023-04-18 DIAGNOSIS — S161XXA Strain of muscle, fascia and tendon at neck level, initial encounter: Secondary | ICD-10-CM | POA: Diagnosis not present

## 2023-04-18 DIAGNOSIS — E119 Type 2 diabetes mellitus without complications: Secondary | ICD-10-CM | POA: Insufficient documentation

## 2023-04-18 DIAGNOSIS — S199XXA Unspecified injury of neck, initial encounter: Secondary | ICD-10-CM | POA: Diagnosis present

## 2023-04-18 DIAGNOSIS — J45909 Unspecified asthma, uncomplicated: Secondary | ICD-10-CM | POA: Insufficient documentation

## 2023-04-18 DIAGNOSIS — I10 Essential (primary) hypertension: Secondary | ICD-10-CM | POA: Diagnosis not present

## 2023-04-18 DIAGNOSIS — Y92410 Unspecified street and highway as the place of occurrence of the external cause: Secondary | ICD-10-CM | POA: Insufficient documentation
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
## 2024-01-11 ENCOUNTER — Other Ambulatory Visit: Payer: Self-pay | Admitting: Nurse Practitioner

## 2024-01-11 DIAGNOSIS — Z1231 Encounter for screening mammogram for malignant neoplasm of breast: Secondary | ICD-10-CM
